# Patient Record
Sex: Female | Born: 1985 | Race: Black or African American | Hispanic: No | Marital: Married | Smoking: Current every day smoker
Health system: Southern US, Community
[De-identification: ages and names within clinical notes are randomized; demographics above are authoritative.]

## PROBLEM LIST (undated history)

## (undated) DIAGNOSIS — F419 Anxiety disorder, unspecified: Secondary | ICD-10-CM

## (undated) DIAGNOSIS — N390 Urinary tract infection, site not specified: Secondary | ICD-10-CM

## (undated) DIAGNOSIS — G43909 Migraine, unspecified, not intractable, without status migrainosus: Secondary | ICD-10-CM

## (undated) DIAGNOSIS — A599 Trichomoniasis, unspecified: Secondary | ICD-10-CM

## (undated) DIAGNOSIS — R87619 Unspecified abnormal cytological findings in specimens from cervix uteri: Secondary | ICD-10-CM

## (undated) DIAGNOSIS — A630 Anogenital (venereal) warts: Secondary | ICD-10-CM

## (undated) DIAGNOSIS — IMO0002 Reserved for concepts with insufficient information to code with codable children: Secondary | ICD-10-CM

## (undated) DIAGNOSIS — A549 Gonococcal infection, unspecified: Secondary | ICD-10-CM

## (undated) DIAGNOSIS — R569 Unspecified convulsions: Secondary | ICD-10-CM

## (undated) DIAGNOSIS — F32A Depression, unspecified: Secondary | ICD-10-CM

## (undated) DIAGNOSIS — F329 Major depressive disorder, single episode, unspecified: Secondary | ICD-10-CM

## (undated) DIAGNOSIS — N200 Calculus of kidney: Secondary | ICD-10-CM

## (undated) DIAGNOSIS — J069 Acute upper respiratory infection, unspecified: Secondary | ICD-10-CM

## (undated) HISTORY — PX: NO PAST SURGERIES: SHX2092

## (undated) HISTORY — PX: TUBAL LIGATION: SHX77

---

## 2006-09-05 ENCOUNTER — Inpatient Hospital Stay (HOSPITAL_COMMUNITY): Admission: AD | Admit: 2006-09-05 | Discharge: 2006-09-05 | Payer: Self-pay | Admitting: Obstetrics & Gynecology

## 2009-06-07 ENCOUNTER — Emergency Department (HOSPITAL_BASED_OUTPATIENT_CLINIC_OR_DEPARTMENT_OTHER): Admission: EM | Admit: 2009-06-07 | Discharge: 2009-06-08 | Payer: Self-pay | Admitting: Emergency Medicine

## 2009-06-08 ENCOUNTER — Inpatient Hospital Stay (HOSPITAL_COMMUNITY): Admission: AD | Admit: 2009-06-08 | Discharge: 2009-06-08 | Payer: Self-pay | Admitting: Obstetrics & Gynecology

## 2009-07-20 ENCOUNTER — Emergency Department (HOSPITAL_BASED_OUTPATIENT_CLINIC_OR_DEPARTMENT_OTHER): Admission: EM | Admit: 2009-07-20 | Discharge: 2009-07-20 | Payer: Self-pay | Admitting: Emergency Medicine

## 2010-03-02 ENCOUNTER — Emergency Department (HOSPITAL_BASED_OUTPATIENT_CLINIC_OR_DEPARTMENT_OTHER): Admission: EM | Admit: 2010-03-02 | Discharge: 2010-03-02 | Payer: Self-pay | Admitting: Emergency Medicine

## 2010-03-23 ENCOUNTER — Emergency Department (HOSPITAL_BASED_OUTPATIENT_CLINIC_OR_DEPARTMENT_OTHER): Admission: EM | Admit: 2010-03-23 | Discharge: 2010-03-23 | Payer: Self-pay | Admitting: Emergency Medicine

## 2010-04-15 ENCOUNTER — Emergency Department (HOSPITAL_BASED_OUTPATIENT_CLINIC_OR_DEPARTMENT_OTHER): Admission: EM | Admit: 2010-04-15 | Discharge: 2010-04-16 | Payer: Self-pay | Admitting: Emergency Medicine

## 2010-05-23 ENCOUNTER — Emergency Department (HOSPITAL_BASED_OUTPATIENT_CLINIC_OR_DEPARTMENT_OTHER): Admission: EM | Admit: 2010-05-23 | Discharge: 2010-05-23 | Payer: Self-pay | Admitting: Emergency Medicine

## 2011-02-27 LAB — URINE MICROSCOPIC-ADD ON

## 2011-02-27 LAB — PREGNANCY, URINE: Preg Test, Ur: NEGATIVE

## 2011-02-27 LAB — URINALYSIS, ROUTINE W REFLEX MICROSCOPIC
Nitrite: NEGATIVE
Protein, ur: NEGATIVE mg/dL
Specific Gravity, Urine: 1.02 (ref 1.005–1.030)
Urobilinogen, UA: 0.2 mg/dL (ref 0.0–1.0)

## 2011-03-06 LAB — URINALYSIS, ROUTINE W REFLEX MICROSCOPIC
Bilirubin Urine: NEGATIVE
Glucose, UA: NEGATIVE mg/dL
Hgb urine dipstick: NEGATIVE
Ketones, ur: NEGATIVE mg/dL
Protein, ur: NEGATIVE mg/dL
Urobilinogen, UA: 0.2 mg/dL (ref 0.0–1.0)

## 2011-03-06 LAB — BASIC METABOLIC PANEL
CO2: 28 mEq/L (ref 19–32)
Calcium: 9.5 mg/dL (ref 8.4–10.5)
Chloride: 106 mEq/L (ref 96–112)
Creatinine, Ser: 0.8 mg/dL (ref 0.4–1.2)
Glucose, Bld: 81 mg/dL (ref 70–99)
Sodium: 145 mEq/L (ref 135–145)

## 2011-03-06 LAB — URINE MICROSCOPIC-ADD ON

## 2011-03-06 LAB — CBC
Hemoglobin: 13 g/dL (ref 12.0–15.0)
MCHC: 33.3 g/dL (ref 30.0–36.0)
MCV: 86 fL (ref 78.0–100.0)
RDW: 13 % (ref 11.5–15.5)

## 2011-03-06 LAB — DIFFERENTIAL
Basophils Absolute: 0.1 10*3/uL (ref 0.0–0.1)
Basophils Relative: 1 % (ref 0–1)
Eosinophils Absolute: 0.1 10*3/uL (ref 0.0–0.7)
Monocytes Absolute: 0.7 10*3/uL (ref 0.1–1.0)
Monocytes Relative: 15 % — ABNORMAL HIGH (ref 3–12)
Neutro Abs: 2.6 10*3/uL (ref 1.7–7.7)

## 2011-03-18 LAB — URINALYSIS, ROUTINE W REFLEX MICROSCOPIC
Bilirubin Urine: NEGATIVE
Nitrite: NEGATIVE
Specific Gravity, Urine: 1.019 (ref 1.005–1.030)
Urobilinogen, UA: 1 mg/dL (ref 0.0–1.0)
pH: 8.5 — ABNORMAL HIGH (ref 5.0–8.0)

## 2011-03-18 LAB — WET PREP, GENITAL

## 2011-03-18 LAB — GC/CHLAMYDIA PROBE AMP, GENITAL: Chlamydia, DNA Probe: NEGATIVE

## 2011-03-18 LAB — PREGNANCY, URINE: Preg Test, Ur: NEGATIVE

## 2011-03-18 LAB — URINE MICROSCOPIC-ADD ON

## 2011-03-18 LAB — RPR: RPR Ser Ql: NONREACTIVE

## 2011-03-20 LAB — WET PREP, GENITAL
Trich, Wet Prep: NONE SEEN
Yeast Wet Prep HPF POC: NONE SEEN

## 2011-03-20 LAB — URINALYSIS, ROUTINE W REFLEX MICROSCOPIC
Bilirubin Urine: NEGATIVE
Glucose, UA: NEGATIVE mg/dL
Ketones, ur: 15 mg/dL — AB
Leukocytes, UA: NEGATIVE
Nitrite: NEGATIVE
Protein, ur: 30 mg/dL — AB
Specific Gravity, Urine: 1.021 (ref 1.005–1.030)
Urobilinogen, UA: 1 mg/dL (ref 0.0–1.0)
pH: 6.5 (ref 5.0–8.0)

## 2011-03-20 LAB — PREGNANCY, URINE: Preg Test, Ur: NEGATIVE

## 2011-03-20 LAB — GC/CHLAMYDIA PROBE AMP, GENITAL
Chlamydia, DNA Probe: NEGATIVE
GC Probe Amp, Genital: NEGATIVE

## 2011-03-20 LAB — URINE MICROSCOPIC-ADD ON

## 2011-05-14 ENCOUNTER — Emergency Department (HOSPITAL_BASED_OUTPATIENT_CLINIC_OR_DEPARTMENT_OTHER)
Admission: EM | Admit: 2011-05-14 | Discharge: 2011-05-14 | Disposition: A | Discharge status: Home / Self Care | Payer: Self-pay | Attending: Emergency Medicine | Admitting: Emergency Medicine

## 2011-05-14 DIAGNOSIS — S5010XA Contusion of unspecified forearm, initial encounter: Secondary | ICD-10-CM | POA: Insufficient documentation

## 2011-06-29 ENCOUNTER — Emergency Department (HOSPITAL_BASED_OUTPATIENT_CLINIC_OR_DEPARTMENT_OTHER)
Admission: EM | Admit: 2011-06-29 | Discharge: 2011-06-29 | Disposition: A | Discharge status: Home / Self Care | Payer: Self-pay | Attending: Emergency Medicine | Admitting: Emergency Medicine

## 2011-06-29 ENCOUNTER — Encounter: Payer: Self-pay | Admitting: *Deleted

## 2011-06-29 DIAGNOSIS — R10819 Abdominal tenderness, unspecified site: Secondary | ICD-10-CM | POA: Insufficient documentation

## 2011-06-29 DIAGNOSIS — N72 Inflammatory disease of cervix uteri: Secondary | ICD-10-CM | POA: Insufficient documentation

## 2011-06-29 DIAGNOSIS — N949 Unspecified condition associated with female genital organs and menstrual cycle: Secondary | ICD-10-CM | POA: Insufficient documentation

## 2011-06-29 DIAGNOSIS — R109 Unspecified abdominal pain: Secondary | ICD-10-CM | POA: Insufficient documentation

## 2011-06-29 LAB — CBC
HCT: 40.5 % (ref 36.0–46.0)
MCH: 27.8 pg (ref 26.0–34.0)
MCHC: 33.6 g/dL (ref 30.0–36.0)
MCV: 82.7 fL (ref 78.0–100.0)
Platelets: 185 10*3/uL (ref 150–400)
RDW: 13.8 % (ref 11.5–15.5)

## 2011-06-29 LAB — URINE MICROSCOPIC-ADD ON

## 2011-06-29 LAB — URINALYSIS, ROUTINE W REFLEX MICROSCOPIC
Bilirubin Urine: NEGATIVE
Nitrite: NEGATIVE
Protein, ur: NEGATIVE mg/dL
Urobilinogen, UA: 0.2 mg/dL (ref 0.0–1.0)

## 2011-06-29 LAB — COMPREHENSIVE METABOLIC PANEL
Albumin: 4.2 g/dL (ref 3.5–5.2)
BUN: 12 mg/dL (ref 6–23)
Calcium: 10.3 mg/dL (ref 8.4–10.5)
Creatinine, Ser: 0.7 mg/dL (ref 0.50–1.10)
Total Bilirubin: 0.6 mg/dL (ref 0.3–1.2)
Total Protein: 8.2 g/dL (ref 6.0–8.3)

## 2011-06-29 MED ORDER — AZITHROMYCIN 1 G PO PACK
1.0000 g | PACK | Freq: Once | ORAL | Status: AC
  Administered 2011-06-29: 1 g via ORAL
  Filled 2011-06-29: qty 1

## 2011-06-29 MED ORDER — CEFTRIAXONE SODIUM 250 MG IJ SOLR
250.0000 mg | Freq: Once | INTRAMUSCULAR | Status: AC
  Administered 2011-06-29: 250 mg via INTRAMUSCULAR
  Filled 2011-06-29: qty 250

## 2011-06-29 MED ORDER — LIDOCAINE HCL (PF) 1 % IJ SOLN
INTRAMUSCULAR | Status: AC
  Administered 2011-06-29: 03:00:00 via INTRAMUSCULAR
  Filled 2011-06-29: qty 5

## 2011-06-29 NOTE — ED Provider Notes (Signed)
History    chief complaint pelvic pain History of present illness complains of pelvic pain (points to suprapubic area) onset 1.5 weeks ago. Discomfort is constant not made worse by anything no dyspareunia no vaginal discharge last menstrual period 3 weeks ago lasted 8 days, 3 days longer than normal . Pain mild at present. Pain is improved after treatment with ibuprofen. Last bowel movement today, normal. No change in appetite  Chief Complaint  Patient presents with  . Abdominal Cramping   Patient is a 25 y.o. female presenting with cramps.  Abdominal Cramping    History reviewed. No pertinent past medical history.  History reviewed. No pertinent past surgical history.  History reviewed. No pertinent family history.  History  Substance Use Topics  . Smoking status: Current Everyday Smoker -- 0.5 packs/day  . Smokeless tobacco: Not on file  . Alcohol Use: No    OB History    Grav Para Term Preterm Abortions TAB SAB Ect Mult Living                  Review of Systems  Constitutional: Negative.   HENT: Negative.   Respiratory: Negative.   Cardiovascular: Negative.   Gastrointestinal: Negative.   Genitourinary: Positive for pelvic pain.  Musculoskeletal: Negative.   Skin: Negative.   Neurological: Negative.   Hematological: Negative.   Psychiatric/Behavioral: Negative.     Physical Exam  BP 125/79  Pulse 96  Temp(Src) 99.6 F (37.6 C) (Oral)  Resp 16  Wt 160 lb (72.576 kg)  SpO2 100%  LMP 06/20/2011  Physical Exam  Nursing note and vitals reviewed. Constitutional: She appears well-developed and well-nourished.  HENT:  Head: Normocephalic and atraumatic.  Eyes: Conjunctivae are normal. Pupils are equal, round, and reactive to light.  Neck: Neck supple. No tracheal deviation present. No thyromegaly present.  Cardiovascular: Normal rate and regular rhythm.   No murmur heard. Pulmonary/Chest: Effort normal and breath sounds normal.  Abdominal: Soft. Bowel  sounds are normal. She exhibits no distension. There is tenderness in the suprapubic area. There is no rigidity, no guarding and no CVA tenderness. Hernia confirmed negative in the ventral area.    Genitourinary:     Musculoskeletal: Normal range of motion. She exhibits no edema and no tenderness.  Neurological: She is alert. Coordination normal.  Skin: Skin is warm and dry. No rash noted.  Psychiatric: She has a normal mood and affect.    ED Course  Procedures  MDM Results for orders placed during the hospital encounter of 06/29/11  URINALYSIS, ROUTINE W REFLEX MICROSCOPIC      Component Value Range   Color, Urine YELLOW  YELLOW    Appearance CLOUDY (*) CLEAR    Specific Gravity, Urine 1.031 (*) 1.005 - 1.030    pH 6.0  5.0 - 8.0    Glucose, UA NEGATIVE  NEGATIVE (mg/dL)   Hgb urine dipstick MODERATE (*) NEGATIVE    Bilirubin Urine NEGATIVE  NEGATIVE    Ketones, ur 15 (*) NEGATIVE (mg/dL)   Protein, ur NEGATIVE  NEGATIVE (mg/dL)   Urobilinogen, UA 0.2  0.0 - 1.0 (mg/dL)   Nitrite NEGATIVE  NEGATIVE    Leukocytes, UA NEGATIVE  NEGATIVE   PREGNANCY, URINE      Component Value Range   Preg Test, Ur NEGATIVE    CBC      Component Value Range   WBC 8.0  4.0 - 10.5 (K/uL)   RBC 4.90  3.87 - 5.11 (MIL/uL)   Hemoglobin 13.6  12.0 - 15.0 (g/dL)   HCT 16.1  09.6 - 04.5 (%)   MCV 82.7  78.0 - 100.0 (fL)   MCH 27.8  26.0 - 34.0 (pg)   MCHC 33.6  30.0 - 36.0 (g/dL)   RDW 40.9  81.1 - 91.4 (%)   Platelets 185  150 - 400 (K/uL)  COMPREHENSIVE METABOLIC PANEL      Component Value Range   Sodium 141  135 - 145 (mEq/L)   Potassium 4.3  3.5 - 5.1 (mEq/L)   Chloride 103  96 - 112 (mEq/L)   CO2 28  19 - 32 (mEq/L)   Glucose, Bld 103 (*) 70 - 99 (mg/dL)   BUN 12  6 - 23 (mg/dL)   Creatinine, Ser 7.82  0.50 - 1.10 (mg/dL)   Calcium 95.6  8.4 - 10.5 (mg/dL)   Total Protein 8.2  6.0 - 8.3 (g/dL)   Albumin 4.2  3.5 - 5.2 (g/dL)   AST 17  0 - 37 (U/L)   ALT 9  0 - 35 (U/L)    Alkaline Phosphatase 83  39 - 117 (U/L)   Total Bilirubin 0.6  0.3 - 1.2 (mg/dL)   GFR calc non Af Amer >60  >60 (mL/min)   GFR calc Af Amer >60  >60 (mL/min)  URINE MICROSCOPIC-ADD ON      Component Value Range   Squamous Epithelial / LPF MANY (*) RARE    WBC, UA 0-2  <3 (WBC/hpf)   RBC / HPF 3-6  <3 (RBC/hpf)   Bacteria, UA MANY (*) RARE    Urine-Other MUCOUS PRESENT      Note urine contaminated but doubt UTI i.e. patient denies dysuria or frequency. Physical exam most consistent with cervicitis plan Rocephin, Zithromax. Safe sex encouraged.    Doug Sou, MD 06/29/11 0330

## 2011-06-29 NOTE — ED Notes (Signed)
Pt c/o lower abd cramping, pt states " period" is 2 weeks early.

## 2011-06-29 NOTE — ED Notes (Signed)
Pelvic exam performed by Dr Ethelda Chick and Helmut Muster, RN. Specimens collected, pt tolerated well.

## 2011-09-05 ENCOUNTER — Encounter (HOSPITAL_COMMUNITY): Payer: Self-pay | Admitting: *Deleted

## 2011-09-05 ENCOUNTER — Inpatient Hospital Stay (HOSPITAL_COMMUNITY)
Admission: AD | Admit: 2011-09-05 | Discharge: 2011-09-05 | Disposition: A | Discharge status: Home / Self Care | Payer: Medicaid Other | Source: Ambulatory Visit | Attending: Obstetrics & Gynecology | Admitting: Obstetrics & Gynecology

## 2011-09-05 DIAGNOSIS — O2390 Unspecified genitourinary tract infection in pregnancy, unspecified trimester: Secondary | ICD-10-CM | POA: Insufficient documentation

## 2011-09-05 DIAGNOSIS — B9689 Other specified bacterial agents as the cause of diseases classified elsewhere: Secondary | ICD-10-CM | POA: Insufficient documentation

## 2011-09-05 DIAGNOSIS — N760 Acute vaginitis: Secondary | ICD-10-CM | POA: Insufficient documentation

## 2011-09-05 DIAGNOSIS — R1031 Right lower quadrant pain: Secondary | ICD-10-CM | POA: Insufficient documentation

## 2011-09-05 DIAGNOSIS — A499 Bacterial infection, unspecified: Secondary | ICD-10-CM | POA: Insufficient documentation

## 2011-09-05 HISTORY — DX: Unspecified abnormal cytological findings in specimens from cervix uteri: R87.619

## 2011-09-05 HISTORY — DX: Trichomoniasis, unspecified: A59.9

## 2011-09-05 HISTORY — DX: Reserved for concepts with insufficient information to code with codable children: IMO0002

## 2011-09-05 HISTORY — DX: Calculus of kidney: N20.0

## 2011-09-05 HISTORY — DX: Depression, unspecified: F32.A

## 2011-09-05 HISTORY — DX: Major depressive disorder, single episode, unspecified: F32.9

## 2011-09-05 HISTORY — DX: Urinary tract infection, site not specified: N39.0

## 2011-09-05 HISTORY — DX: Anogenital (venereal) warts: A63.0

## 2011-09-05 HISTORY — DX: Acute upper respiratory infection, unspecified: J06.9

## 2011-09-05 HISTORY — DX: Unspecified convulsions: R56.9

## 2011-09-05 HISTORY — DX: Anxiety disorder, unspecified: F41.9

## 2011-09-05 HISTORY — DX: Gonococcal infection, unspecified: A54.9

## 2011-09-05 LAB — URINALYSIS, ROUTINE W REFLEX MICROSCOPIC
Bilirubin Urine: NEGATIVE
Specific Gravity, Urine: 1.02 (ref 1.005–1.030)
pH: 7.5 (ref 5.0–8.0)

## 2011-09-05 LAB — CBC
HCT: 37.9 % (ref 36.0–46.0)
Hemoglobin: 12.5 g/dL (ref 12.0–15.0)
MCV: 85.2 fL (ref 78.0–100.0)
RBC: 4.45 MIL/uL (ref 3.87–5.11)
WBC: 8.6 10*3/uL (ref 4.0–10.5)

## 2011-09-05 LAB — WET PREP, GENITAL
Trich, Wet Prep: NONE SEEN
Yeast Wet Prep HPF POC: NONE SEEN

## 2011-09-05 LAB — POCT PREGNANCY, URINE: Preg Test, Ur: POSITIVE

## 2011-09-05 LAB — URINE MICROSCOPIC-ADD ON

## 2011-09-05 MED ORDER — ONDANSETRON 4 MG PO TBDP
4.0000 mg | ORAL_TABLET | Freq: Once | ORAL | Status: AC
  Administered 2011-09-05: 4 mg via ORAL
  Filled 2011-09-05: qty 1

## 2011-09-05 MED ORDER — METRONIDAZOLE 500 MG PO TABS: 500.0000 mg | ORAL_TABLET | Freq: Two times a day (BID) | ORAL | Status: AC

## 2011-09-05 MED ORDER — PROMETHAZINE HCL 25 MG PO TABS: 25.0000 mg | ORAL_TABLET | Freq: Four times a day (QID) | ORAL | Status: DC | PRN

## 2011-09-05 NOTE — ED Provider Notes (Signed)
History   Pt presents today c/o nausea and RLQ pain that has worsened over the past 2 wks. She states she had a positive UPT at [facility] the beginning of Sept but they were not able to confirm and IUP. She denies fever, vag dc, bleeding, or any other sx at this time.  Chief Complaint  Patient presents with  . Abdominal Pain   HPI  OB History    Grav Para Term Preterm Abortions TAB SAB Ect Mult Living   2 1 1  0 0 0 0 0 0 1      Past Medical History  Diagnosis Date  . Seizures     in high school, unknown cause (last age 28)  . Recurrent upper respiratory infection (URI)   . Urinary tract infection   . Kidney stone     during pregnancy  . Anxiety     hx of meds  . Depression     hx of meds./PTSD  . Abnormal Pap smear   . Gonorrhea   . Trichomonas   . Genital warts     Past Surgical History  Procedure Date  . No past surgeries     No family history on file.  History  Substance Use Topics  . Smoking status: Former Smoker -- 0.5 packs/day  . Smokeless tobacco: Not on file   Comment: quit with preg  . Alcohol Use: Yes     occ, none with preg    Allergies:  Allergies  Allergen Reactions  . Ivp Dye (Iodinated Diagnostic Agents) Anaphylaxis    No prescriptions prior to admission    Review of Systems  Constitutional: Negative for fever.  Cardiovascular: Negative for chest pain.  Gastrointestinal: Positive for nausea and abdominal pain. Negative for vomiting, diarrhea and constipation.  Genitourinary: Negative for dysuria, urgency, frequency and hematuria.  Neurological: Negative for dizziness and headaches.  Psychiatric/Behavioral: Negative for depression and suicidal ideas.   Physical Exam   Blood pressure 120/77, pulse 90, temperature 98.7 F (37.1 C), temperature source Oral, resp. rate 20, height 5\' 7"  (1.702 m), weight 173 lb (78.472 kg), last menstrual period 07/05/2011.  Physical Exam  Constitutional: She is oriented to person, place, and  time. She appears well-developed and well-nourished. No distress.  HENT:  Head: Normocephalic and atraumatic.  Eyes: EOM are normal. Pupils are equal, round, and reactive to light.  GI: Soft. She exhibits no distension. There is no tenderness. There is no rebound and no guarding.  Genitourinary: No bleeding around the vagina. Vaginal discharge found.       Cervix Lg/closed. Thin vag dc present.  Neurological: She is alert and oriented to person, place, and time.  Skin: Skin is warm and dry. She is not diaphoretic.  Psychiatric: She has a normal mood and affect. Her behavior is normal. Judgment and thought content normal.    MAU Course  Procedures  Bedside US demonstrates a single IUP with cardiac activity @ 7.6wks.  Wet prep done.  Results for orders placed during the hospital encounter of 09/05/11 (from the past 24 hour(s))  CBC     Status: Normal   Collection Time   09/05/11  8:30 AM      Component Value Range   WBC 8.6  4.0 - 10.5 (K/uL)   RBC 4.45  3.87 - 5.11 (MIL/uL)   Hemoglobin 12.5  12.0 - 15.0 (g/dL)   HCT 16.1  09.6 - 04.5 (%)   MCV 85.2  78.0 - 100.0 (fL)  MCH 28.1  26.0 - 34.0 (pg)   MCHC 33.0  30.0 - 36.0 (g/dL)   RDW 21.3  08.6 - 57.8 (%)   Platelets 197  150 - 400 (K/uL)  URINALYSIS, ROUTINE W REFLEX MICROSCOPIC     Status: Abnormal   Collection Time   09/05/11  8:46 AM      Component Value Range   Color, Urine YELLOW  YELLOW    Appearance CLEAR  CLEAR    Specific Gravity, Urine 1.020  1.005 - 1.030    pH 7.5  5.0 - 8.0    Glucose, UA NEGATIVE  NEGATIVE (mg/dL)   Hgb urine dipstick TRACE (*) NEGATIVE    Bilirubin Urine NEGATIVE  NEGATIVE    Ketones, ur NEGATIVE  NEGATIVE (mg/dL)   Protein, ur NEGATIVE  NEGATIVE (mg/dL)   Urobilinogen, UA 0.2  0.0 - 1.0 (mg/dL)   Nitrite NEGATIVE  NEGATIVE    Leukocytes, UA NEGATIVE  NEGATIVE   URINE MICROSCOPIC-ADD ON     Status: Abnormal   Collection Time   09/05/11  8:46 AM      Component Value Range   Squamous  Epithelial / LPF FEW (*) RARE    WBC, UA 0-2  <3 (WBC/hpf)   RBC / HPF 0-2  <3 (RBC/hpf)  POCT PREGNANCY, URINE     Status: Normal   Collection Time   09/05/11  8:52 AM      Component Value Range   Preg Test, Ur POSITIVE    WET PREP, GENITAL     Status: Abnormal   Collection Time   09/05/11  9:03 AM      Component Value Range   Yeast, Wet Prep NONE SEEN  NONE SEEN    Trich, Wet Prep NONE SEEN  NONE SEEN    Clue Cells, Wet Prep MODERATE (*) NONE SEEN    WBC, Wet Prep HPF POC FEW (*) NONE SEEN      Assessment and Plan  BV: discussed with pt at length. Will tx with Flagyl. Warned of antabuse reaction.  Pain in preg: pt has an IUP. She will begin prenatal care. Discussed diet, activity, risks, and precautions.  Clinton Gallant. Sophiagrace Benbrook III, DrHSc, MPAS, PA-C  09/05/2011, 9:03 AM   Henrietta Hoover, PA 09/05/11 [phone]

## 2011-09-05 NOTE — Progress Notes (Signed)
Pressure in RLQ.  Found out preg on 09/04 at [facility].  Did [place] and blood work.  Cramping in RLQ and low back.  No bleeding.

## 2011-09-05 NOTE — ED Provider Notes (Signed)
Attestation of Attending Supervision of Advanced Practitioner: Evaluation and management procedures were performed by the PA/NP/CNM/OB Fellow under my supervision/collaboration. Chart reviewed and agree with management and plan.  ANYANWU,UGONNA A 09/05/2011 12:01 PM

## 2011-10-08 ENCOUNTER — Encounter (HOSPITAL_BASED_OUTPATIENT_CLINIC_OR_DEPARTMENT_OTHER): Payer: Self-pay | Admitting: *Deleted

## 2011-10-08 ENCOUNTER — Emergency Department (HOSPITAL_BASED_OUTPATIENT_CLINIC_OR_DEPARTMENT_OTHER)
Admission: EM | Admit: 2011-10-08 | Discharge: 2011-10-08 | Disposition: A | Discharge status: Home / Self Care | Payer: Medicaid Other | Attending: Emergency Medicine | Admitting: Emergency Medicine

## 2011-10-08 DIAGNOSIS — R109 Unspecified abdominal pain: Secondary | ICD-10-CM | POA: Insufficient documentation

## 2011-10-08 DIAGNOSIS — O200 Threatened abortion: Secondary | ICD-10-CM | POA: Insufficient documentation

## 2011-10-08 DIAGNOSIS — Z3490 Encounter for supervision of normal pregnancy, unspecified, unspecified trimester: Secondary | ICD-10-CM

## 2011-10-08 MED ORDER — ONDANSETRON 4 MG PO TBDP
4.0000 mg | ORAL_TABLET | Freq: Once | ORAL | Status: AC
  Administered 2011-10-08: 4 mg via ORAL
  Filled 2011-10-08: qty 1

## 2011-10-08 NOTE — ED Notes (Signed)
Pt states she is 13.[redacted] wks pregnant and having contractions. Recently hospitalized with hyper emesis. Went to [facility], but was "triaged and placed in lobby".

## 2011-10-08 NOTE — ED Provider Notes (Signed)
History     CSN: [id number] Arrival date & time: 10/08/2011  8:29 PM   First MD Initiated Contact with Patient 10/08/11 2039      Chief Complaint  Patient presents with  . Contractions    (Consider location/radiation/quality/duration/timing/severity/associated sxs/prior treatment) Patient is a 25 y.o. female presenting with cramps. The history is provided by the patient. No language interpreter was used.  Abdominal Cramping The primary symptoms of the illness include abdominal pain. The current episode started 13 to 24 hours ago. The onset of the illness was gradual. The problem has been gradually worsening.  The illness is associated with a recent illness. The patient states that she believes she is currently pregnant. The patient has not had a change in bowel habit. Additional symptoms associated with the illness include hematuria and frequency. Symptoms associated with the illness do not include constipation or back pain. Significant associated medical issues do not include PUD, GERD, inflammatory bowel disease or diabetes.  Pt is worried taht she is having contractions.  Pt reports she was admitted at [facility] hospital a week ago for hyperemesis. Pt went to [facility] ED.  Pt reports she had a urine done there.  Past Medical History  Diagnosis Date  . Seizures     in high school, unknown cause (last age 62)  . Recurrent upper respiratory infection (URI)   . Urinary tract infection   . Kidney stone     during pregnancy  . Anxiety     hx of meds  . Depression     hx of meds./PTSD  . Abnormal Pap smear   . Gonorrhea   . Trichomonas   . Genital warts     Past Surgical History  Procedure Date  . No past surgeries     History reviewed. No pertinent family history.  History  Substance Use Topics  . Smoking status: Former Smoker -- 0.5 packs/day  . Smokeless tobacco: Not on file   Comment: quit with preg  . Alcohol Use: Yes     occ, none with preg    OB History    Grav Para Term Preterm Abortions TAB SAB Ect Mult Living   2 1 1  0 0 0 0 0 0 1      Review of Systems  Gastrointestinal: Positive for abdominal pain. Negative for constipation.  Genitourinary: Positive for frequency and hematuria.  Musculoskeletal: Negative for back pain.  All other systems reviewed and are negative.    Allergies  Ivp dye  Home Medications   Current Outpatient Rx  Name Route Sig Dispense Refill  . ONDANSETRON HCL 8 MG PO TABS Oral Take 8 mg by mouth every 8 (eight) hours as needed. For nausea    . PROMETHAZINE HCL 25 MG PO TABS Oral Take 25 mg by mouth every 6 (six) hours as needed. For nausea      BP 123/78  Pulse 114  Temp(Src) 98.5 F (36.9 C) (Oral)  Resp 20  Ht 5\' 7"  (1.702 m)  Wt 175 lb (79.379 kg)  BMI 27.41 kg/m2  SpO2 100%  LMP 07/05/2011  Physical Exam  Nursing note and vitals reviewed. Constitutional: She appears well-developed and well-nourished.  HENT:  Head: Normocephalic.  Right Ear: External ear normal.  Left Ear: External ear normal.  Nose: Nose normal.  Mouth/Throat: Oropharynx is clear and moist.  Eyes: Conjunctivae and EOM are normal. Pupils are equal, round, and reactive to light.  Neck: Normal range of motion. Neck supple.  Cardiovascular: Normal  rate.   Pulmonary/Chest: Effort normal.  Abdominal: Soft.  Musculoskeletal: Normal range of motion.  Neurological: She is alert.  Skin: Skin is warm.  Psychiatric: She has a normal mood and affect.    ED Course  Procedures (including critical care time)  Labs Reviewed - No data to display No results found.   No diagnosis found.    MDM  Ultrasound good fetal movement,  Fht's 140's.  No evidence of contractions,  Cervix is closed.  Pt advised to follow up at [facility].  Pt given zofran here.  Pt tolerating po fluids well.  Pt advised to drink fluids to stay hydrated.        Langston Masker, [place] 10/08/11 2141  Langston Masker, [place] 10/08/11 2144  Medical screening  examination/treatment/procedure(s) were conducted as a shared visit with non-physician practitioner(s) and myself.  I was immediately available.     Sunnie Nielsen, MD 10/08/11 [phone]

## 2013-07-22 ENCOUNTER — Emergency Department (HOSPITAL_BASED_OUTPATIENT_CLINIC_OR_DEPARTMENT_OTHER)
Admission: EM | Admit: 2013-07-22 | Discharge: 2013-07-22 | Disposition: A | Discharge status: Home / Self Care | Payer: Self-pay | Attending: Emergency Medicine | Admitting: Emergency Medicine

## 2013-07-22 ENCOUNTER — Emergency Department (HOSPITAL_BASED_OUTPATIENT_CLINIC_OR_DEPARTMENT_OTHER): Payer: Self-pay

## 2013-07-22 ENCOUNTER — Encounter (HOSPITAL_BASED_OUTPATIENT_CLINIC_OR_DEPARTMENT_OTHER): Payer: Self-pay | Admitting: *Deleted

## 2013-07-22 DIAGNOSIS — J069 Acute upper respiratory infection, unspecified: Secondary | ICD-10-CM | POA: Insufficient documentation

## 2013-07-22 DIAGNOSIS — R05 Cough: Secondary | ICD-10-CM | POA: Insufficient documentation

## 2013-07-22 DIAGNOSIS — Z87442 Personal history of urinary calculi: Secondary | ICD-10-CM | POA: Insufficient documentation

## 2013-07-22 DIAGNOSIS — Z8669 Personal history of other diseases of the nervous system and sense organs: Secondary | ICD-10-CM | POA: Insufficient documentation

## 2013-07-22 DIAGNOSIS — R0789 Other chest pain: Secondary | ICD-10-CM | POA: Insufficient documentation

## 2013-07-22 DIAGNOSIS — Z87440 Personal history of urinary (tract) infections: Secondary | ICD-10-CM | POA: Insufficient documentation

## 2013-07-22 DIAGNOSIS — J029 Acute pharyngitis, unspecified: Secondary | ICD-10-CM | POA: Insufficient documentation

## 2013-07-22 DIAGNOSIS — R51 Headache: Secondary | ICD-10-CM | POA: Insufficient documentation

## 2013-07-22 DIAGNOSIS — Z8619 Personal history of other infectious and parasitic diseases: Secondary | ICD-10-CM | POA: Insufficient documentation

## 2013-07-22 DIAGNOSIS — J3489 Other specified disorders of nose and nasal sinuses: Secondary | ICD-10-CM | POA: Insufficient documentation

## 2013-07-22 DIAGNOSIS — Z8659 Personal history of other mental and behavioral disorders: Secondary | ICD-10-CM | POA: Insufficient documentation

## 2013-07-22 DIAGNOSIS — R059 Cough, unspecified: Secondary | ICD-10-CM | POA: Insufficient documentation

## 2013-07-22 DIAGNOSIS — Z87891 Personal history of nicotine dependence: Secondary | ICD-10-CM | POA: Insufficient documentation

## 2013-07-22 MED ORDER — IBUPROFEN 800 MG PO TABS
800.0000 mg | ORAL_TABLET | Freq: Once | ORAL | Status: AC
  Administered 2013-07-22: 800 mg via ORAL
  Filled 2013-07-22: qty 1

## 2013-07-22 NOTE — ED Provider Notes (Signed)
CSN: [id number]     Arrival date & time 07/22/13  2051 History     First MD Initiated Contact with Patient 07/22/13 2110     Chief Complaint  Patient presents with  . Shortness of Breath   (Consider location/radiation/quality/duration/timing/severity/associated sxs/prior Treatment) Patient is a 27 y.o. female presenting with URI. The history is provided by the patient.  URI Presenting symptoms: congestion, cough and sore throat   Presenting symptoms: no ear pain and no fever   Severity:  Moderate Onset quality:  Sudden Duration:  2 days Timing:  Constant Progression:  Worsening Chronicity:  New Associated symptoms: headaches   Associated symptoms: no neck pain and no wheezing   Risk factors: sick contacts (son has been sick w/in last week)     Past Medical History  Diagnosis Date  . Seizures     in high school, unknown cause (last age 68)  . Recurrent upper respiratory infection (URI)   . Urinary tract infection   . Kidney stone     during pregnancy  . Anxiety     hx of meds  . Depression     hx of meds./PTSD  . Abnormal Pap smear   . Gonorrhea   . Trichomonas   . Genital warts    Past Surgical History  Procedure Laterality Date  . No past surgeries     No family history on file. History  Substance Use Topics  . Smoking status: Former Smoker -- 0.50 packs/day  . Smokeless tobacco: Not on file     Comment: quit with preg  . Alcohol Use: Yes     Comment: occ, none with preg   OB History   Grav Para Term Preterm Abortions TAB SAB Ect Mult Living   2 1 1  0 0 0 0 0 0 1     Review of Systems  Constitutional: Negative for fever.  HENT: Positive for congestion and sore throat. Negative for ear pain and neck pain.   Respiratory: Positive for cough and chest tightness. Negative for wheezing.   Gastrointestinal: Negative for vomiting and abdominal pain.  Genitourinary: Negative for dysuria.  Neurological: Positive for headaches. Negative for weakness and  numbness.  All other systems reviewed and are negative.    Allergies  Ivp dye  Home Medications   Current Outpatient Rx  Name  Route  Sig  Dispense  Refill  . ondansetron (ZOFRAN) 8 MG tablet   Oral   Take 8 mg by mouth every 8 (eight) hours as needed. For nausea         . EXPIRED: promethazine (PHENERGAN) 25 MG tablet   Oral   Take 1 tablet (25 mg total) by mouth every 6 (six) hours as needed for nausea.   30 tablet   0   . promethazine (PHENERGAN) 25 MG tablet   Oral   Take 25 mg by mouth every 6 (six) hours as needed. For nausea          BP 141/84  Pulse 91  Temp(Src) 98.7 F (37.1 C) (Oral)  Resp 18  Ht 5\' 7"  (1.702 m)  Wt 157 lb (71.215 kg)  BMI 24.58 kg/m2  SpO2 100%  LMP 07/17/2013  Breastfeeding? Unknown Physical Exam  Vitals reviewed. Constitutional: She is oriented to person, place, and time. She appears well-developed and well-nourished.  HENT:  Head: Normocephalic and atraumatic.  Right Ear: External ear normal.  Left Ear: External ear normal.  Nose: Nose normal.  Mouth/Throat: No oropharyngeal exudate.  Eyes: Right eye exhibits no discharge. Left eye exhibits no discharge.  Cardiovascular: Normal rate, regular rhythm and normal heart sounds.   Pulmonary/Chest: Effort normal and breath sounds normal. She has no wheezes. She has no rales.  Abdominal: Soft. There is no tenderness.  Neurological: She is alert and oriented to person, place, and time. She has normal strength. No sensory deficit.  Skin: Skin is warm and dry.    ED Course   Procedures (including critical care time)  Labs Reviewed - No data to display Dg Chest 2 View  07/22/2013   *RADIOLOGY REPORT*  Clinical Data: Chest pain and shortness of breath  CHEST - 2 VIEW  Comparison: None.  Findings: Cardiomediastinal silhouette is within normal limits. The lungs are clear. No pleural effusion.  No pneumothorax.  No acute osseous abnormality.  IMPRESSION: Normal chest.   Original Report  Authenticated By: Christiana Pellant, M.D.   1. Upper respiratory infection     MDM  27 year old female with a constellation of symptoms consistent with an upper respiratory infection. All of her symptoms started within the last couple of days. The symptoms include headache, chest pain, sore throat and congestion. No signs of pneumonia or other bacterial illness. She does have some pleuritic chest pain this seems to be associated with her cough. She is otherwise low risk for PE is negative perc. This is stable for her symptomatic care at home for her URI.  Audree Camel, MD 07/23/13 [phone]

## 2013-07-22 NOTE — ED Notes (Signed)
Pt c/o chest tightness, SOB, non-productive cough, and HA that began yesterday. Pt recently discovered mold in her residence.

## 2013-07-22 NOTE — ED Notes (Signed)
Patient transported to X-ray 

## 2014-10-12 ENCOUNTER — Encounter (HOSPITAL_BASED_OUTPATIENT_CLINIC_OR_DEPARTMENT_OTHER): Payer: Self-pay | Admitting: *Deleted

## 2015-02-28 ENCOUNTER — Emergency Department (HOSPITAL_BASED_OUTPATIENT_CLINIC_OR_DEPARTMENT_OTHER)
Admission: EM | Admit: 2015-02-28 | Discharge: 2015-02-28 | Disposition: A | Discharge status: Home / Self Care | Payer: 59 | Attending: Emergency Medicine | Admitting: Emergency Medicine

## 2015-02-28 ENCOUNTER — Encounter (HOSPITAL_BASED_OUTPATIENT_CLINIC_OR_DEPARTMENT_OTHER): Payer: Self-pay

## 2015-02-28 DIAGNOSIS — Z8619 Personal history of other infectious and parasitic diseases: Secondary | ICD-10-CM | POA: Insufficient documentation

## 2015-02-28 DIAGNOSIS — Z87440 Personal history of urinary (tract) infections: Secondary | ICD-10-CM | POA: Insufficient documentation

## 2015-02-28 DIAGNOSIS — Z87442 Personal history of urinary calculi: Secondary | ICD-10-CM | POA: Insufficient documentation

## 2015-02-28 DIAGNOSIS — Z79899 Other long term (current) drug therapy: Secondary | ICD-10-CM | POA: Insufficient documentation

## 2015-02-28 DIAGNOSIS — Z87891 Personal history of nicotine dependence: Secondary | ICD-10-CM | POA: Diagnosis not present

## 2015-02-28 DIAGNOSIS — R0981 Nasal congestion: Secondary | ICD-10-CM | POA: Diagnosis present

## 2015-02-28 DIAGNOSIS — J111 Influenza due to unidentified influenza virus with other respiratory manifestations: Secondary | ICD-10-CM | POA: Insufficient documentation

## 2015-02-28 DIAGNOSIS — Z8659 Personal history of other mental and behavioral disorders: Secondary | ICD-10-CM | POA: Diagnosis not present

## 2015-02-28 NOTE — Discharge Instructions (Signed)

## 2015-02-28 NOTE — ED Provider Notes (Signed)
CSN: [id number]     Arrival date & time 02/28/15  0905 History   First MD Initiated Contact with Patient 02/28/15 0915     Chief Complaint  Patient presents with  . Nasal Congestion     (Consider location/radiation/quality/duration/timing/severity/associated sxs/prior Treatment) HPI Comments: Patient presents with flulike symptoms. She was diagnosed with the flu 2 days ago. She had a rapid flu a [facility] that was positive. Her symptoms started the day prior to the positive flu test. This is the fourth day of symptoms. She's been taking Tamiflu for 2 days. She states overall she feels a little bit better but doesn't feel significantly better. She was having a little bit of shortness of breath initially but denies any current shortness of breath. She feels achy all over. She's continuing to run some intermittent fevers. She denies any nausea or vomiting. She continues to have cough and nasal congestion. She's here with her 65-year-old who now has the same symptoms.   Past Medical History  Diagnosis Date  . Seizures     in high school, unknown cause (last age 28)  . Recurrent upper respiratory infection (URI)   . Urinary tract infection   . Kidney stone     during pregnancy  . Anxiety     hx of meds  . Depression     hx of meds./PTSD  . Abnormal Pap smear   . Gonorrhea   . Trichomonas   . Genital warts    Past Surgical History  Procedure Laterality Date  . No past surgeries     No family history on file. History  Substance Use Topics  . Smoking status: Former Smoker -- 0.50 packs/day  . Smokeless tobacco: Not on file     Comment: quit with preg  . Alcohol Use: Yes     Comment: occ, none with preg   OB History    Gravida Para Term Preterm AB TAB SAB Ectopic Multiple Living   0 0 0 0 0 0 1     Review of Systems  Constitutional: Positive for fever, chills and fatigue. Negative for diaphoresis.  HENT: Positive for congestion and rhinorrhea. Negative  for sneezing.   Eyes: Negative.   Respiratory: Positive for cough. Negative for chest tightness and shortness of breath.   Cardiovascular: Negative for chest pain and leg swelling.  Gastrointestinal: Negative for nausea, vomiting, abdominal pain, diarrhea and blood in stool.  Genitourinary: Negative for frequency, hematuria, flank pain and difficulty urinating.  Musculoskeletal: Positive for myalgias. Negative for back pain and arthralgias.  Skin: Negative for rash.  Neurological: Negative for dizziness, speech difficulty, weakness, numbness and headaches.      Allergies  Ivp dye  Home Medications   Prior to Admission medications   Medication Sig Start Date End Date Taking? Authorizing Provider  oseltamivir (TAMIFLU) 75 MG capsule Take 75 mg by mouth 2 (two) times daily.    Yes Historical Provider, MD  promethazine (PHENERGAN) 25 MG tablet Take 25 mg by mouth every 6 (six) hours as needed. For nausea    Historical Provider, MD   BP 118/76 mmHg  Pulse 84  Temp(Src) 98.9 F (37.2 C) (Oral)  Resp 18  Ht  (1.702 m)  Wt 155 lb (70.308 kg)  BMI 24.27 kg/m2  SpO2 100% Physical Exam  Constitutional: She is oriented to person, place, and time. She appears well-developed and well-nourished.  HENT:  Head: Normocephalic and atraumatic.  Right Ear: External ear  normal.  Left Ear: External ear normal.  Mouth/Throat: Oropharynx is clear and moist.  Eyes: Pupils are equal, round, and reactive to light.  Neck: Normal range of motion. Neck supple.  Cardiovascular: Normal rate, regular rhythm and normal heart sounds.   Pulmonary/Chest: Effort normal and breath sounds normal. No respiratory distress. She has no wheezes. She has no rales. She exhibits no tenderness.  Abdominal: Soft. Bowel sounds are normal. There is no tenderness. There is no rebound and no guarding.  Musculoskeletal: Normal range of motion. She exhibits no edema.  Lymphadenopathy:    She has no cervical adenopathy.   Neurological: She is alert and oriented to person, place, and time.  Skin: Skin is warm and dry. No rash noted.  Psychiatric: She has a normal mood and affect.    ED Course  Procedures (including critical care time) Labs Review Labs Reviewed - No data to display  Imaging Review No results found.   EKG Interpretation None      MDM   Final diagnoses:  Influenza    Patient presents with flulike symptoms. She had a recently positive rapid flu test. She is currently on Tamiflu. Her lungs are clear and she has no hypoxia. She has no suggestions of pneumonia. She has no vomiting. I advised her in symptomatic care and to continue the Tamiflu. I advised her return if she has any worsening symptoms.    Rolan BuccoMelanie Brelyn Woehl, MD 02/28/15 [phone]

## 2015-02-28 NOTE — ED Notes (Addendum)
Patient here with congestion and cough since Thursday. Describes as non-productive. Patient reports that she was started on tamiflu Thursday after being [facility].

## 2015-05-11 ENCOUNTER — Emergency Department (HOSPITAL_BASED_OUTPATIENT_CLINIC_OR_DEPARTMENT_OTHER)
Admission: EM | Admit: 2015-05-11 | Discharge: 2015-05-11 | Disposition: A | Discharge status: Home / Self Care | Payer: 59 | Attending: Emergency Medicine | Admitting: Emergency Medicine

## 2015-05-11 ENCOUNTER — Emergency Department (HOSPITAL_BASED_OUTPATIENT_CLINIC_OR_DEPARTMENT_OTHER): Payer: 59

## 2015-05-11 ENCOUNTER — Encounter (HOSPITAL_BASED_OUTPATIENT_CLINIC_OR_DEPARTMENT_OTHER): Payer: Self-pay | Admitting: Emergency Medicine

## 2015-05-11 DIAGNOSIS — Z79899 Other long term (current) drug therapy: Secondary | ICD-10-CM | POA: Diagnosis not present

## 2015-05-11 DIAGNOSIS — Z8619 Personal history of other infectious and parasitic diseases: Secondary | ICD-10-CM | POA: Diagnosis not present

## 2015-05-11 DIAGNOSIS — Z8709 Personal history of other diseases of the respiratory system: Secondary | ICD-10-CM | POA: Diagnosis not present

## 2015-05-11 DIAGNOSIS — Z87442 Personal history of urinary calculi: Secondary | ICD-10-CM | POA: Insufficient documentation

## 2015-05-11 DIAGNOSIS — Z87891 Personal history of nicotine dependence: Secondary | ICD-10-CM | POA: Diagnosis not present

## 2015-05-11 DIAGNOSIS — Z87440 Personal history of urinary (tract) infections: Secondary | ICD-10-CM | POA: Diagnosis not present

## 2015-05-11 DIAGNOSIS — Z8659 Personal history of other mental and behavioral disorders: Secondary | ICD-10-CM | POA: Diagnosis not present

## 2015-05-11 DIAGNOSIS — R079 Chest pain, unspecified: Secondary | ICD-10-CM | POA: Diagnosis not present

## 2015-05-11 LAB — CBC WITH DIFFERENTIAL/PLATELET
BASOS ABS: 0 10*3/uL (ref 0.0–0.1)
BASOS PCT: 0 % (ref 0–1)
Eosinophils Absolute: 0.2 10*3/uL (ref 0.0–0.7)
Eosinophils Relative: 3 % (ref 0–5)
HEMATOCRIT: 41.4 % (ref 36.0–46.0)
Hemoglobin: 13.3 g/dL (ref 12.0–15.0)
LYMPHS PCT: 34 % (ref 12–46)
Lymphs Abs: 2.1 10*3/uL (ref 0.7–4.0)
MCH: 27.3 pg (ref 26.0–34.0)
MCHC: 32.1 g/dL (ref 30.0–36.0)
MCV: 85 fL (ref 78.0–100.0)
Monocytes Absolute: 1.2 10*3/uL — ABNORMAL HIGH (ref 0.1–1.0)
Monocytes Relative: 18 % — ABNORMAL HIGH (ref 3–12)
NEUTROS ABS: 2.8 10*3/uL (ref 1.7–7.7)
Neutrophils Relative %: 45 % (ref 43–77)
PLATELETS: 192 10*3/uL (ref 150–400)
RBC: 4.87 MIL/uL (ref 3.87–5.11)
RDW: 13.5 % (ref 11.5–15.5)
WBC: 6.3 10*3/uL (ref 4.0–10.5)

## 2015-05-11 LAB — BASIC METABOLIC PANEL
ANION GAP: 9 (ref 5–15)
BUN: 18 mg/dL (ref 6–20)
CALCIUM: 9.8 mg/dL (ref 8.9–10.3)
CHLORIDE: 100 mmol/L — AB (ref 101–111)
CO2: 25 mmol/L (ref 22–32)
CREATININE: 0.77 mg/dL (ref 0.44–1.00)
GFR calc non Af Amer: >60 mL/min (ref >60)
Glucose, Bld: 98 mg/dL (ref 65–99)
POTASSIUM: 3.8 mmol/L (ref 3.5–5.1)
SODIUM: 134 mmol/L — AB (ref 135–145)

## 2015-05-11 LAB — TROPONIN I: Troponin I: <0.03 ng/mL (ref <0.031)

## 2015-05-11 LAB — D-DIMER, QUANTITATIVE (NOT AT ARMC)

## 2015-05-11 MED ORDER — IBUPROFEN 200 MG PO TABS: 600.0000 mg | ORAL_TABLET | Freq: Once | ORAL | Status: DC

## 2015-05-11 MED ORDER — KETOROLAC TROMETHAMINE 30 MG/ML IJ SOLN
15.0000 mg | Freq: Once | INTRAMUSCULAR | Status: AC
  Administered 2015-05-11: 15 mg via INTRAVENOUS
  Filled 2015-05-11: qty 1

## 2015-05-11 NOTE — ED Notes (Signed)
Pt in c/o central chest pain since 1630, worse on inspiration. Mild SOB. NAD in triage.

## 2015-05-11 NOTE — Discharge Instructions (Signed)

## 2015-05-11 NOTE — ED Provider Notes (Signed)
CSN: [id number]     Arrival date & time 05/11/15  1947 History   First MD Initiated Contact with Patient 05/11/15 2005     Chief Complaint  Patient presents with  . Chest Pain      HPI Patient presents with chief complaint of chest pain which started around 4:30 PM while at work.  patient states pain is pleuritic in nature.  No fever chills.  No history of cardiac or lung problems.  Patient denies nausea vomiting or diaphoresis.  No recent trauma.  No recent cough.  Patient otherwise is healthy and has no medical problems. Past Medical History  Diagnosis Date  . Seizures     in high school, unknown cause (last age 29)  . Recurrent upper respiratory infection (URI)   . Urinary tract infection   . Kidney stone     during pregnancy  . Anxiety     hx of meds  . Depression     hx of meds./PTSD  . Abnormal Pap smear   . Gonorrhea   . Trichomonas   . Genital warts    Past Surgical History  Procedure Laterality Date  . No past surgeries     History reviewed. No pertinent family history. History  Substance Use Topics  . Smoking status: Former Smoker -- 0.50 packs/day  . Smokeless tobacco: Not on file     Comment: quit with preg  . Alcohol Use: Yes     Comment: occ, none with preg   OB History    Gravida Para Term Preterm AB TAB SAB Ectopic Multiple Living   2 1 1  0 0 0 0 0 0 1     Review of Systems  All other systems reviewed and are negative  Allergies  Ivp dye  Home Medications   Prior to Admission medications   Medication Sig Start Date End Date Taking? Authorizing Provider  oseltamivir (TAMIFLU) 75 MG capsule Take 75 mg by mouth 2 (two) times daily.     Historical Provider, MD  promethazine (PHENERGAN) 25 MG tablet Take 25 mg by mouth every 6 (six) hours as needed. For nausea    Historical Provider, MD   BP 121/70 mmHg  Pulse 88  Resp 20  SpO2 100%  LMP 05/02/2015 Physical Exam  Constitutional: She is oriented to person, place, and time. She appears  well-developed and well-nourished. No distress.  HENT:  Head: Normocephalic and atraumatic.  Eyes: Pupils are equal, round, and reactive to light.  Neck: Normal range of motion.  Cardiovascular: Normal rate and intact distal pulses.   Pulmonary/Chest: No respiratory distress. She has no wheezes. She has no rales.  Abdominal: Normal appearance. She exhibits no distension. There is no tenderness. There is no rebound.  Musculoskeletal: Normal range of motion.  Neurological: She is alert and oriented to person, place, and time. No cranial nerve deficit.  Skin: Skin is warm and dry. No rash noted.  Psychiatric: She has a normal mood and affect. Her behavior is normal.  Nursing note and vitals reviewed.   ED Course  Procedures (including critical care time) Labs Review Labs Reviewed  BASIC METABOLIC PANEL - Abnormal; Notable for the following:    Sodium 134 (*)    Chloride 100 (*)    All other components within normal limits  CBC WITH DIFFERENTIAL/PLATELET - Abnormal; Notable for the following:    Monocytes Relative 18 (*)    Monocytes Absolute 1.2 (*)    All other components within normal limits  TROPONIN I  D-DIMER, QUANTITATIVE (NOT AT [facility])  TROPONIN I  TROPONIN I    Imaging Review Dg Chest 2 View  05/11/2015   CLINICAL DATA:  Left-sided chest pain with shortness of breath for 4 hours.  EXAM: CHEST  2 VIEW  COMPARISON:  02/26/2015  FINDINGS: The cardiomediastinal contours are normal. The lungs are clear. Pulmonary vasculature is normal. No consolidation, pleural effusion, or pneumothorax. No acute osseous abnormalities are seen.  IMPRESSION: No acute pulmonary process.   Electronically Signed   By: Rubye Oaks M.D.   On: 05/11/2015 21:04     EKG Interpretation   Date/Time:  Tuesday May 11 2015 20:09:23 EDT Ventricular Rate:  88 PR Interval:  158 QRS Duration: 78 QT Interval:  348 QTC Calculation: 421 R Axis:   67 Text Interpretation:  Normal sinus rhythm Early  repolarization Normal ECG  Confirmed by Radford Pax  MD, Daved Mcfann ([id number]) on 05/11/2015 8:15:38 PM      MDM   Final diagnoses:  Chest pain        Nelva Nay, MD 05/11/15 2318

## 2015-09-28 ENCOUNTER — Encounter (HOSPITAL_BASED_OUTPATIENT_CLINIC_OR_DEPARTMENT_OTHER): Payer: Self-pay

## 2015-09-28 ENCOUNTER — Emergency Department (HOSPITAL_BASED_OUTPATIENT_CLINIC_OR_DEPARTMENT_OTHER)
Admission: EM | Admit: 2015-09-28 | Discharge: 2015-09-29 | Disposition: A | Discharge status: Home / Self Care | Payer: 59 | Attending: Emergency Medicine | Admitting: Emergency Medicine

## 2015-09-28 DIAGNOSIS — Z8659 Personal history of other mental and behavioral disorders: Secondary | ICD-10-CM | POA: Insufficient documentation

## 2015-09-28 DIAGNOSIS — Z87891 Personal history of nicotine dependence: Secondary | ICD-10-CM | POA: Insufficient documentation

## 2015-09-28 DIAGNOSIS — Z87442 Personal history of urinary calculi: Secondary | ICD-10-CM | POA: Diagnosis not present

## 2015-09-28 DIAGNOSIS — R519 Headache, unspecified: Secondary | ICD-10-CM

## 2015-09-28 DIAGNOSIS — R51 Headache: Secondary | ICD-10-CM | POA: Insufficient documentation

## 2015-09-28 DIAGNOSIS — Z8709 Personal history of other diseases of the respiratory system: Secondary | ICD-10-CM | POA: Insufficient documentation

## 2015-09-28 DIAGNOSIS — Z87440 Personal history of urinary (tract) infections: Secondary | ICD-10-CM | POA: Diagnosis not present

## 2015-09-28 DIAGNOSIS — Z8619 Personal history of other infectious and parasitic diseases: Secondary | ICD-10-CM | POA: Insufficient documentation

## 2015-09-28 MED ORDER — DIPHENHYDRAMINE HCL 50 MG/ML IJ SOLN
25.0000 mg | Freq: Once | INTRAMUSCULAR | Status: AC
  Administered 2015-09-28: 25 mg via INTRAVENOUS
  Filled 2015-09-28: qty 1

## 2015-09-28 MED ORDER — METOCLOPRAMIDE HCL 5 MG/ML IJ SOLN
10.0000 mg | Freq: Once | INTRAMUSCULAR | Status: AC
  Administered 2015-09-28: 10 mg via INTRAVENOUS
  Filled 2015-09-28: qty 2

## 2015-09-28 MED ORDER — KETOROLAC TROMETHAMINE 15 MG/ML IJ SOLN
15.0000 mg | Freq: Once | INTRAMUSCULAR | Status: AC
  Administered 2015-09-28: 15 mg via INTRAVENOUS
  Filled 2015-09-28: qty 1

## 2015-09-28 MED ORDER — ONDANSETRON HCL 4 MG/2ML IJ SOLN
4.0000 mg | Freq: Once | INTRAMUSCULAR | Status: AC
  Administered 2015-09-28: 4 mg via INTRAVENOUS
  Filled 2015-09-28: qty 2

## 2015-09-28 NOTE — ED Notes (Signed)
C/o migraine,n/v since 4pm

## 2015-09-28 NOTE — ED Notes (Signed)
MD at bedside. 

## 2015-09-28 NOTE — ED Provider Notes (Signed)
CSN: [id number]     Arrival date & time 09/28/15  2142 History  By signing my name below, I, Gwenyth Oberatherine Macek, attest that this documentation has been prepared under the direction and in the presence of Paula LibraJohn Dieter Hane, MD.  Electronically Signed: Gwenyth Oberatherine Macek, ED Scribe. 09/28/2015. 11:38 PM.   Chief Complaint  Patient presents with  . Migraine   The history is provided by the patient. No language interpreter was used.    HPI Comments: Christine Barnes is a 29 y.o. female with a history of migraines who presents to the Emergency Department complaining of an 8/10, bilateral, throbbing biparietal (R>L) HA that started 7 hours ago. She reports nausea, vomiting and light-headedness as associated symptoms. Pt was administered IV fluids and Zofran in the ED prior to my evaluation with relief to her nausea but not the headache. She denies blurred vision but has had lightheadedness. Her headache is worse with exposure to light.  Past Medical History  Diagnosis Date  . Seizures (HCC)     in high school, unknown cause (last age 29)  . Recurrent upper respiratory infection (URI)   . Urinary tract infection   . Kidney stone     during pregnancy  . Anxiety     hx of meds  . Depression     hx of meds./PTSD  . Abnormal Pap smear   . Gonorrhea   . Trichomonas   . Genital warts    Past Surgical History  Procedure Laterality Date  . No past surgeries     No family history on file. Social History  Substance Use Topics  . Smoking status: Former Smoker -- 0.50 packs/day  . Smokeless tobacco: None     Comment: quit with preg  . Alcohol Use: Yes     Comment: occ   OB History    Gravida Para Term Preterm AB TAB SAB Ectopic Multiple Living   2 1 1  0 0 0 0 0 0 1     Review of Systems 10 Systems reviewed and all are negative for acute change except as noted in the HPI.  Allergies  Ivp dye  Home Medications   Prior to Admission medications   Not on File   BP 144/90 mmHg  Pulse 78   Temp(Src) 98.3 F (36.8 C) (Oral)  Resp 18  Ht 5\' 7"  (1.702 m)  Wt 155 lb (70.308 kg)  BMI 24.27 kg/m2  SpO2 100%  LMP 09/22/2015   Physical Exam General: Well-developed, well-nourished female in no acute distress; appearance consistent with age of record HENT: normocephalic; atraumatic Eyes: pupils equal, round and reactive to light; extraocular muscles intact Neck: supple Heart: regular rate and rhythm Lungs: clear to auscultation bilaterally Abdomen: soft; nondistended; nontender; bowel sounds present Extremities: No deformity; full range of motion; pulses normal Neurologic: Awake, alert and oriented; motor function intact in all extremities and symmetric; no facial droop Skin: Warm and dry Psychiatric: Flat affect  ED Course  Procedures  DIAGNOSTIC STUDIES: Oxygen Saturation is 100% on RA, normal by my interpretation.    COORDINATION OF CARE: 11:38 PM Discussed treatment plan with pt. Pt agreed to plan.    MDM  12:28 AM Headache resolved with IV medications.  I personally performed the services described in this documentation, which was scribed in my presence. The recorded information has been reviewed and is accurate.   Paula LibraJohn Caprice Mccaffrey, MD 09/29/15 0030

## 2015-09-29 NOTE — ED Notes (Signed)
MD at bedside. 

## 2016-03-03 ENCOUNTER — Emergency Department (HOSPITAL_BASED_OUTPATIENT_CLINIC_OR_DEPARTMENT_OTHER)
Admission: EM | Admit: 2016-03-03 | Discharge: 2016-03-03 | Disposition: A | Discharge status: Home / Self Care | Payer: 59 | Attending: Emergency Medicine | Admitting: Emergency Medicine

## 2016-03-03 ENCOUNTER — Encounter (HOSPITAL_BASED_OUTPATIENT_CLINIC_OR_DEPARTMENT_OTHER): Payer: Self-pay | Admitting: *Deleted

## 2016-03-03 DIAGNOSIS — Z8619 Personal history of other infectious and parasitic diseases: Secondary | ICD-10-CM | POA: Insufficient documentation

## 2016-03-03 DIAGNOSIS — Z87891 Personal history of nicotine dependence: Secondary | ICD-10-CM | POA: Diagnosis not present

## 2016-03-03 DIAGNOSIS — Z87442 Personal history of urinary calculi: Secondary | ICD-10-CM | POA: Diagnosis not present

## 2016-03-03 DIAGNOSIS — Z8659 Personal history of other mental and behavioral disorders: Secondary | ICD-10-CM | POA: Insufficient documentation

## 2016-03-03 DIAGNOSIS — Z87440 Personal history of urinary (tract) infections: Secondary | ICD-10-CM | POA: Diagnosis not present

## 2016-03-03 DIAGNOSIS — R0789 Other chest pain: Secondary | ICD-10-CM | POA: Insufficient documentation

## 2016-03-03 DIAGNOSIS — Z8709 Personal history of other diseases of the respiratory system: Secondary | ICD-10-CM | POA: Insufficient documentation

## 2016-03-03 DIAGNOSIS — R079 Chest pain, unspecified: Secondary | ICD-10-CM | POA: Diagnosis present

## 2016-03-03 DIAGNOSIS — R0781 Pleurodynia: Secondary | ICD-10-CM

## 2016-03-03 NOTE — Discharge Instructions (Signed)
Please follow-up with her primary care provider in one week for reevaluation further management. Please return immediately if any new or worsening signs or symptoms present. Please follow-up with OB/GYN for further evaluation and management of recurrent pregnancy.
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
## 2016-03-03 NOTE — ED Notes (Signed)
Pt verbalizes understanding of d/c instructions and denies any further needs at this time. 

## 2016-07-17 ENCOUNTER — Encounter (HOSPITAL_BASED_OUTPATIENT_CLINIC_OR_DEPARTMENT_OTHER): Payer: Self-pay | Admitting: *Deleted

## 2016-07-17 ENCOUNTER — Emergency Department (HOSPITAL_BASED_OUTPATIENT_CLINIC_OR_DEPARTMENT_OTHER)
Admission: EM | Admit: 2016-07-17 | Discharge: 2016-07-17 | Disposition: A | Discharge status: Home / Self Care | Payer: 59 | Attending: Emergency Medicine | Admitting: Emergency Medicine

## 2016-07-17 DIAGNOSIS — O26891 Other specified pregnancy related conditions, first trimester: Secondary | ICD-10-CM | POA: Diagnosis not present

## 2016-07-17 DIAGNOSIS — O219 Vomiting of pregnancy, unspecified: Secondary | ICD-10-CM | POA: Diagnosis not present

## 2016-07-17 DIAGNOSIS — Z3A11 11 weeks gestation of pregnancy: Secondary | ICD-10-CM | POA: Diagnosis not present

## 2016-07-17 DIAGNOSIS — R51 Headache: Secondary | ICD-10-CM | POA: Insufficient documentation

## 2016-07-17 DIAGNOSIS — Z87891 Personal history of nicotine dependence: Secondary | ICD-10-CM | POA: Diagnosis not present

## 2016-07-17 DIAGNOSIS — R112 Nausea with vomiting, unspecified: Secondary | ICD-10-CM

## 2016-07-17 LAB — COMPREHENSIVE METABOLIC PANEL
ALBUMIN: 3.8 g/dL (ref 3.5–5.0)
ALT: 16 U/L (ref 14–54)
AST: 21 U/L (ref 15–41)
Alkaline Phosphatase: 51 U/L (ref 38–126)
Anion gap: 4 — ABNORMAL LOW (ref 5–15)
BUN: 8 mg/dL (ref 6–20)
CHLORIDE: 104 mmol/L (ref 101–111)
CO2: 27 mmol/L (ref 22–32)
CREATININE: 0.55 mg/dL (ref 0.44–1.00)
Calcium: 8.9 mg/dL (ref 8.9–10.3)
GFR calc Af Amer: >60 mL/min (ref >60)
GFR calc non Af Amer: >60 mL/min (ref >60)
GLUCOSE: 83 mg/dL (ref 65–99)
POTASSIUM: 3.4 mmol/L — AB (ref 3.5–5.1)
SODIUM: 135 mmol/L (ref 135–145)
Total Bilirubin: 0.7 mg/dL (ref 0.3–1.2)
Total Protein: 7 g/dL (ref 6.5–8.1)

## 2016-07-17 LAB — URINALYSIS, ROUTINE W REFLEX MICROSCOPIC
BILIRUBIN URINE: NEGATIVE
GLUCOSE, UA: NEGATIVE mg/dL
HGB URINE DIPSTICK: NEGATIVE
Ketones, ur: 15 mg/dL — AB
Leukocytes, UA: NEGATIVE
Nitrite: NEGATIVE
PH: 7 (ref 5.0–8.0)
Protein, ur: NEGATIVE mg/dL
SPECIFIC GRAVITY, URINE: 1.031 — AB (ref 1.005–1.030)

## 2016-07-17 LAB — CBC WITH DIFFERENTIAL/PLATELET
BASOS ABS: 0 10*3/uL (ref 0.0–0.1)
BASOS PCT: 0 %
EOS PCT: 1 %
Eosinophils Absolute: 0.1 10*3/uL (ref 0.0–0.7)
HCT: 37.8 % (ref 36.0–46.0)
Hemoglobin: 12.7 g/dL (ref 12.0–15.0)
LYMPHS PCT: 19 %
Lymphs Abs: 1.5 10*3/uL (ref 0.7–4.0)
MCH: 28.9 pg (ref 26.0–34.0)
MCHC: 33.6 g/dL (ref 30.0–36.0)
MCV: 85.9 fL (ref 78.0–100.0)
Monocytes Absolute: 1 10*3/uL (ref 0.1–1.0)
Monocytes Relative: 13 %
Neutro Abs: 5.3 10*3/uL (ref 1.7–7.7)
Neutrophils Relative %: 67 %
PLATELETS: 179 10*3/uL (ref 150–400)
RBC: 4.4 MIL/uL (ref 3.87–5.11)
RDW: 14 % (ref 11.5–15.5)
WBC: 7.9 10*3/uL (ref 4.0–10.5)

## 2016-07-17 MED ORDER — METOCLOPRAMIDE HCL 5 MG/ML IJ SOLN
10.0000 mg | Freq: Once | INTRAMUSCULAR | Status: AC
  Administered 2016-07-17: 10 mg via INTRAVENOUS
  Filled 2016-07-17: qty 2

## 2016-07-17 MED ORDER — DEXTROSE-NACL 5-0.45 % IV SOLN: INTRAVENOUS | Status: DC

## 2016-07-17 MED ORDER — PYRIDOXINE HCL 100 MG/ML IJ SOLN
100.0000 mg | Freq: Once | INTRAMUSCULAR | Status: AC
  Administered 2016-07-17: 100 mg via INTRAVENOUS
  Filled 2016-07-17: qty 1

## 2016-07-17 MED ORDER — ACETAMINOPHEN 500 MG PO TABS
1000.0000 mg | ORAL_TABLET | Freq: Once | ORAL | Status: AC
  Administered 2016-07-17: 1000 mg via ORAL
  Filled 2016-07-17: qty 2

## 2016-07-17 MED ORDER — ACETAMINOPHEN 500 MG PO TABS: 1000.0000 mg | ORAL_TABLET | Freq: Once | ORAL | Status: DC

## 2016-07-17 MED ORDER — PROMETHAZINE HCL 25 MG PO TABS: 25.0000 mg | ORAL_TABLET | Freq: Four times a day (QID) | ORAL | 0 refills | Status: DC | PRN

## 2016-07-17 MED ORDER — VITAMIN B-6 100 MG PO TABS
100.0000 mg | ORAL_TABLET | Freq: Every day | ORAL | Status: DC
  Filled 2016-07-17: qty 1

## 2016-07-17 MED ORDER — PROMETHAZINE HCL 25 MG/ML IJ SOLN
25.0000 mg | Freq: Once | INTRAMUSCULAR | Status: AC
  Administered 2016-07-17: 25 mg via INTRAVENOUS
  Filled 2016-07-17: qty 1

## 2016-07-17 MED ORDER — METOCLOPRAMIDE HCL 10 MG PO TABS
10.0000 mg | ORAL_TABLET | Freq: Once | ORAL | Status: DC
Start: 2016-07-17 — End: 2016-07-17

## 2016-07-17 NOTE — ED Triage Notes (Signed)
Pt reports nausea and vomiting x yesterday. Denies any vag d/c, bleeding, pain or other pregnancy related c/o, pt states "I just feel weak from not eating."

## 2016-07-17 NOTE — ED Notes (Signed)
Pt unable to give UA at this time 

## 2016-07-17 NOTE — ED Provider Notes (Signed)
[facility]   CSN: [id number] Arrival date & time: 07/17/16  [id number]  First Provider Contact:  First MD Initiated Contact with Patient 07/17/16 [phone]        History   Chief Complaint Chief Complaint  Patient presents with  . Other    pregnant/vomiting    HPI Christine Fischeranisha N Garris is a 30 y.o. female Complaint of headache, nausea and vomiting. She is [redacted] weeks pregnant. Patient states that she has had mild nausea throughout her pregnancy. However, she started vomiting yesterday and has been unable to hold down anything by mouth. She has a has a history of migraine headaches and complains of a migraine headache at this time. Describes it as throbbing, left-sided. She had some scintillations this morning. She denies abdominal pain, diarrhea.   HPI  Past Medical History:  Diagnosis Date  . Abnormal Pap smear   . Anxiety    hx of meds  . Depression    hx of meds./PTSD  . Genital warts   . Gonorrhea   . Kidney stone    during pregnancy  . Recurrent upper respiratory infection (URI)   . Seizures (HCC)    in high school, unknown cause (last age 30)  . Trichomonas   . Urinary tract infection     There are no active problems to display for this patient.   Past Surgical History:  Procedure Laterality Date  . NO PAST SURGERIES      OB History    Gravida Para Term Preterm AB Living   3 1 1  0 0 1   SAB TAB Ectopic Multiple Live Births   0 0 0 0 1       Home Medications    Prior to Admission medications   Not on File    Family History History reviewed. No pertinent family history.  Social History Social History  Substance Use Topics  . Smoking status: Former Smoker    Packs/day: 0.50  . Smokeless tobacco: Never Used     Comment: quit with preg  . Alcohol use Yes     Comment: occ     Allergies   Ivp dye [iodinated diagnostic agents]   Review of Systems Review of Systems  Ten systems reviewed and are negative for acute change,  except as noted in the HPI.   Physical Exam Updated Vital Signs BP 119/80 (BP Location: Left Arm)   Pulse 88   Temp 98.8 F (37.1 C) (Oral)   Resp 18   Ht 5\' 7"  (1.702 m)   Wt 72.6 kg   LMP 09/22/2015   SpO2 100%   BMI 25.06 kg/m   Physical Exam Physical Exam  Constitutional: She is oriented to person, place, and time. She appears well-developed and well-nourished. No distress.  HENT:  Head: Normocephalic and atraumatic.  Eyes: Conjunctivae are normal. No scleral icterus.  Neck: Normal range of motion.  Cardiovascular: Normal rate, regular rhythm and normal heart sounds.  Exam reveals no gallop and no friction rub.   No murmur heard. Pulmonary/Chest: Effort normal and breath sounds normal. No respiratory distress. She exhibits no tenderness.  Abdominal: Soft. Bowel sounds are normal. She exhibits no distension and no mass. There is no tenderness. There is no guarding.  Neurological: She is alert and oriented to person, place, and time.  Speech is clear and goal oriented, follows commands Major Cranial nerves without deficit, no facial droop Normal strength in upper and lower extremities bilaterally including dorsiflexion and plantar  flexion, strong and equal grip strength Sensation normal to light and sharp touch Moves extremities without ataxia, coordination intact Normal finger to nose and rapid alternating movements Neg romberg, no pronator drift Normal gait Normal heel-shin and balance   Skin: Skin is warm and dry. She is not diaphoretic.  Nursing note and vitals reviewed.   ED Treatments / Results  Labs (all labs ordered are listed, but only abnormal results are displayed) Labs Reviewed  CBC WITH DIFFERENTIAL/PLATELET  COMPREHENSIVE METABOLIC PANEL  URINALYSIS, ROUTINE W REFLEX MICROSCOPIC (NOT AT [facility])    EKG  EKG Interpretation None       Radiology No results found.  Procedures Procedures (including critical care time)  Medications Ordered in  ED Medications  metoCLOPramide (REGLAN) injection 10 mg (10 mg Intravenous Given 07/17/16 0936)  pyridOXINE (B-6) injection 100 mg (100 mg Intravenous Given 07/17/16 0936)  acetaminophen (TYLENOL) tablet 1,000 mg (1,000 mg Oral Given 07/17/16 0936)     Initial Impression / Assessment and Plan / ED Course  I have reviewed the triage vital signs and the nursing notes.  Pertinent labs & imaging results that were available during my care of the patient were reviewed by me and considered in my medical decision making (see chart for details).  Clinical Course  Comment By Time  LMP 05/04/2016 Arthor Captain, PA-C 08/07 7829    Patient headache resolved with Tylenol, medications given in the ED listed above. Patient has improved significantly with Phenergan, fluids. She is a new multiple packs of crackers without vomiting. Patient was given a small amount of Phenergan to go home with. Advised patient to follow-up with her OB/GYN as soon as possible. She was safe for discharge at this time, lab work reviewed without significant abnormalities. Final Clinical Impressions(s) / ED Diagnoses   Final diagnoses:  None    New Prescriptions New Prescriptions   No medications on file     Arthor Captain, PA-C 07/17/16 1656    Geoffery Lyons, MD 07/18/16 [phone]

## 2016-07-19 ENCOUNTER — Telehealth (HOSPITAL_BASED_OUTPATIENT_CLINIC_OR_DEPARTMENT_OTHER): Payer: Self-pay | Admitting: *Deleted

## 2016-07-19 NOTE — Telephone Encounter (Signed)
Pt seen Monday. Requesting note to return to work today. Chart reviewed by Arthor CaptainAbigail Harris, PA. Note provided to return to work 07/19/2016 per [facility]. Pt advised she can pick note up from registration

## 2018-06-30 ENCOUNTER — Emergency Department (HOSPITAL_BASED_OUTPATIENT_CLINIC_OR_DEPARTMENT_OTHER)
Admission: EM | Admit: 2018-06-30 | Discharge: 2018-07-01 | Disposition: A | Discharge status: Home / Self Care | Payer: No Typology Code available for payment source | Attending: Emergency Medicine | Admitting: Emergency Medicine

## 2018-06-30 ENCOUNTER — Encounter (HOSPITAL_BASED_OUTPATIENT_CLINIC_OR_DEPARTMENT_OTHER): Payer: Self-pay | Admitting: *Deleted

## 2018-06-30 ENCOUNTER — Other Ambulatory Visit: Payer: Self-pay

## 2018-06-30 DIAGNOSIS — Y92410 Unspecified street and highway as the place of occurrence of the external cause: Secondary | ICD-10-CM | POA: Insufficient documentation

## 2018-06-30 DIAGNOSIS — Y999 Unspecified external cause status: Secondary | ICD-10-CM | POA: Diagnosis not present

## 2018-06-30 DIAGNOSIS — Z87891 Personal history of nicotine dependence: Secondary | ICD-10-CM | POA: Insufficient documentation

## 2018-06-30 DIAGNOSIS — Y9389 Activity, other specified: Secondary | ICD-10-CM | POA: Insufficient documentation

## 2018-06-30 DIAGNOSIS — S8012XA Contusion of left lower leg, initial encounter: Secondary | ICD-10-CM | POA: Diagnosis not present

## 2018-06-30 DIAGNOSIS — S8982XA Other specified injuries of left lower leg, initial encounter: Secondary | ICD-10-CM | POA: Diagnosis present
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
## 2018-07-01 NOTE — ED Notes (Signed)
Pt given d/c instructions as per chart. Verbalizes understanding. No questions. 
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
## 2018-07-01 NOTE — ED Notes (Signed)
ED Provider at bedside. 
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
## 2018-08-31 ENCOUNTER — Encounter (HOSPITAL_BASED_OUTPATIENT_CLINIC_OR_DEPARTMENT_OTHER): Payer: Self-pay | Admitting: Emergency Medicine

## 2018-08-31 ENCOUNTER — Emergency Department (HOSPITAL_BASED_OUTPATIENT_CLINIC_OR_DEPARTMENT_OTHER): Payer: 59

## 2018-08-31 ENCOUNTER — Other Ambulatory Visit: Payer: Self-pay

## 2018-08-31 ENCOUNTER — Emergency Department (HOSPITAL_BASED_OUTPATIENT_CLINIC_OR_DEPARTMENT_OTHER)
Admission: EM | Admit: 2018-08-31 | Discharge: 2018-08-31 | Disposition: A | Discharge status: Home / Self Care | Payer: 59 | Attending: Emergency Medicine | Admitting: Emergency Medicine

## 2018-08-31 DIAGNOSIS — Y999 Unspecified external cause status: Secondary | ICD-10-CM | POA: Diagnosis not present

## 2018-08-31 DIAGNOSIS — S60221A Contusion of right hand, initial encounter: Secondary | ICD-10-CM | POA: Insufficient documentation

## 2018-08-31 DIAGNOSIS — W2209XA Striking against other stationary object, initial encounter: Secondary | ICD-10-CM | POA: Insufficient documentation

## 2018-08-31 DIAGNOSIS — Y92009 Unspecified place in unspecified non-institutional (private) residence as the place of occurrence of the external cause: Secondary | ICD-10-CM | POA: Insufficient documentation

## 2018-08-31 DIAGNOSIS — Y939 Activity, unspecified: Secondary | ICD-10-CM | POA: Insufficient documentation

## 2018-08-31 DIAGNOSIS — Z87891 Personal history of nicotine dependence: Secondary | ICD-10-CM | POA: Diagnosis not present

## 2018-08-31 MED ORDER — IBUPROFEN 400 MG PO TABS
600.0000 mg | ORAL_TABLET | Freq: Once | ORAL | Status: AC
  Administered 2018-08-31: 600 mg via ORAL
  Filled 2018-08-31: qty 1

## 2018-08-31 NOTE — ED Triage Notes (Signed)
Patient states that she hit her hand on a wall yesterday  - patient has pain to her right hand

## 2018-08-31 NOTE — ED Provider Notes (Signed)
[facility] EMERGENCY DEPARTMENT Provider Note   CSN: [id number] Arrival date & time: 08/31/18  1310     History   Chief Complaint Chief Complaint  Patient presents with  . Hand Injury    HPI Christine Barnes is a 32 y.o. female.  HPI  32 year old female presents with right hand pain.  She tripped over her children's toys and fell to the ground.  She was angry and then punched a wall.  She is having ulnar hand pain and some swelling.  Has not taken anything for this.  No weakness or numbness.  Has pain when trying to close her fist.  No wrist pain. Pain is rated as an 8/10.  Past Medical History:  Diagnosis Date  . Abnormal Pap smear   . Anxiety    hx of meds  . Depression    hx of meds./PTSD  . Genital warts   . Gonorrhea   . Kidney stone    during pregnancy  . Recurrent upper respiratory infection (URI)   . Seizures (HCC)    in high school, unknown cause (last age 32)  . Trichomonas   . Urinary tract infection     There are no active problems to display for this patient.   Past Surgical History:  Procedure Laterality Date  . NO PAST SURGERIES    . TUBAL LIGATION       OB History    Gravida  3   Para  1   Term  1   Preterm  0   AB  0   Living  1     SAB  0   TAB  0   Ectopic  0   Multiple  0   Live Births  1            Home Medications    Prior to Admission medications   Medication Sig Start Date End Date Taking? Authorizing Provider  promethazine (PHENERGAN) 25 MG tablet Take 1 tablet (25 mg total) by mouth every 6 (six) hours as needed for nausea or vomiting. 07/17/16   Arthor CaptainHarris, Abigail, PA-C    Family History History reviewed. No pertinent family history.  Social History Social History   Tobacco Use  . Smoking status: Former Smoker    Packs/day: 0.50  . Smokeless tobacco: Never Used  . Tobacco comment: quit with preg  Substance Use Topics  . Alcohol use: Yes    Comment: occ  . Drug use: No      Allergies   Ioxaglate; Ivp dye [iodinated diagnostic agents]; and Metrizamide   Review of Systems Review of Systems  Musculoskeletal: Positive for arthralgias and joint swelling.  Neurological: Negative for weakness and numbness.     Physical Exam Updated Vital Signs BP 118/78 (BP Location: Left Arm)   Pulse 93   Temp 98.7 F (37.1 C) (Oral)   Resp 18   Ht 5\' 7"  (1.702 m)   Wt 66.2 kg   LMP 08/11/2018   SpO2 99%   BMI 22.87 kg/m   Physical Exam  Constitutional: She appears well-developed and well-nourished. No distress.  HENT:  Head: Normocephalic and atraumatic.  Eyes: Right eye exhibits no discharge. Left eye exhibits no discharge.  Cardiovascular: Normal rate and regular rhythm.  Pulses:      Radial pulses are 2+ on the right side.  Pulmonary/Chest: Effort normal.  Abdominal: She exhibits no distension.  Musculoskeletal:       Right wrist: She exhibits normal range  of motion, no tenderness, no bony tenderness and no swelling.       Right hand: She exhibits tenderness and swelling. Normal sensation noted.       Hands: Neurological: She is alert.  Skin: Skin is warm and dry. She is not diaphoretic. No erythema.  Nursing note and vitals reviewed.    ED Treatments / Results  Labs (all labs ordered are listed, but only abnormal results are displayed) Labs Reviewed - No data to display  EKG None  Radiology Dg Hand Complete Right  Result Date: 08/31/2018 CLINICAL DATA:  Hit right hand on a wall. Pain around the fifth metacarpal area. EXAM: RIGHT HAND - COMPLETE 3+ VIEW COMPARISON:  None. FINDINGS: There is no evidence of fracture or dislocation. There is no evidence of arthropathy or other focal bone abnormality. Soft tissues are unremarkable. IMPRESSION: Negative. Electronically Signed   By: Amie Portland M.D.   On: 08/31/2018 13:33    Procedures Procedures (including critical care time)  Medications Ordered in ED Medications  ibuprofen  (ADVIL,MOTRIN) tablet 600 mg (has no administration in time range)     Initial Impression / Assessment and Plan / ED Course  I have reviewed the triage vital signs and the nursing notes.  Pertinent labs & imaging results that were available during my care of the patient were reviewed by me and considered in my medical decision making (see chart for details).     X-rays negative.  No scaphoid tenderness.  Suspicion for an occult fracture is pretty low.  Ibuprofen, Tylenol, and ice.  Consistent with a hand contusion.  Final Clinical Impressions(s) / ED Diagnoses   Final diagnoses:  Contusion of right hand, initial encounter    ED Discharge Orders    None       Pricilla Loveless, MD 08/31/18 1348

## 2018-08-31 NOTE — ED Notes (Signed)
Pt/family verbalized understanding of discharge instructions.   

## 2019-03-07 ENCOUNTER — Emergency Department (HOSPITAL_BASED_OUTPATIENT_CLINIC_OR_DEPARTMENT_OTHER): Payer: 59

## 2019-03-07 ENCOUNTER — Other Ambulatory Visit: Payer: Self-pay

## 2019-03-07 ENCOUNTER — Encounter (HOSPITAL_BASED_OUTPATIENT_CLINIC_OR_DEPARTMENT_OTHER): Payer: Self-pay

## 2019-03-07 ENCOUNTER — Emergency Department (HOSPITAL_BASED_OUTPATIENT_CLINIC_OR_DEPARTMENT_OTHER)
Admission: EM | Admit: 2019-03-07 | Discharge: 2019-03-07 | Disposition: A | Discharge status: Home / Self Care | Payer: 59 | Attending: Emergency Medicine | Admitting: Emergency Medicine

## 2019-03-07 DIAGNOSIS — Z87891 Personal history of nicotine dependence: Secondary | ICD-10-CM | POA: Diagnosis not present

## 2019-03-07 DIAGNOSIS — G43809 Other migraine, not intractable, without status migrainosus: Secondary | ICD-10-CM | POA: Insufficient documentation

## 2019-03-07 DIAGNOSIS — R51 Headache: Secondary | ICD-10-CM | POA: Diagnosis present

## 2019-03-07 LAB — PREGNANCY, URINE: Preg Test, Ur: NEGATIVE

## 2019-03-07 MED ORDER — ONDANSETRON HCL 4 MG/2ML IJ SOLN
4.0000 mg | Freq: Once | INTRAMUSCULAR | Status: AC
  Administered 2019-03-07: 4 mg via INTRAVENOUS
  Filled 2019-03-07: qty 2

## 2019-03-07 MED ORDER — KETOROLAC TROMETHAMINE 30 MG/ML IJ SOLN
30.0000 mg | Freq: Once | INTRAMUSCULAR | Status: AC
  Administered 2019-03-07: 30 mg via INTRAVENOUS
  Filled 2019-03-07: qty 1

## 2019-03-07 MED ORDER — METOCLOPRAMIDE HCL 5 MG/ML IJ SOLN
10.0000 mg | Freq: Once | INTRAMUSCULAR | Status: AC
Start: 2019-03-07 — End: 2019-03-07
  Administered 2019-03-07: 10 mg via INTRAVENOUS
  Filled 2019-03-07: qty 2

## 2019-03-07 MED ORDER — SODIUM CHLORIDE 0.9 % IV BOLUS
1000.0000 mL | Freq: Once | INTRAVENOUS | Status: AC
  Administered 2019-03-07: 1000 mL via INTRAVENOUS

## 2019-03-07 MED ORDER — DIPHENHYDRAMINE HCL 50 MG/ML IJ SOLN
25.0000 mg | Freq: Once | INTRAMUSCULAR | Status: AC
  Administered 2019-03-07: 25 mg via INTRAVENOUS
  Filled 2019-03-07: qty 1

## 2019-03-07 NOTE — ED Notes (Signed)
Pt provided Sprite per EDP orders, comfort measures also provided, updated on plan of care

## 2019-03-07 NOTE — ED Notes (Signed)
Pt. Left for radiology as soon as IV started.  RN informed room RN that the IV was placed and that the Pt. Has gone to Radiology

## 2019-03-07 NOTE — ED Triage Notes (Signed)
Nasal congestion for two days with headache and vomiting today. States that headaches sometimes make her nauseated. Pt states she "just don't feel good," after yelling at nurse that she already told registration what was wrong.
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
## 2019-03-07 NOTE — Discharge Instructions (Signed)
Follow-up with referred neurology office.  Call their office and arrange for an appointment.  Return emergency department for any worsening headache, fever, vision changes, vomiting or any other worsening or concerning symptoms.

## 2020-08-23 IMAGING — CT CT HEAD WITHOUT CONTRAST
3 series · 16 of 47 positions shown, 19 images · non-contrast
Comparison: None.

CLINICAL DATA: Headache, nausea, vomiting, and photosensitivity.
History of migraines.

EXAM:
CT HEAD WITHOUT CONTRAST
TECHNIQUE: Contiguous axial images were obtained from the base of the skull
through the vertex without intravenous contrast.

[Series 2: head wo · axial · 0.44mm/px · z∈[-102,+34]mm · 10 of 33 slices shown, 13 images]
[im 3/33  brain]
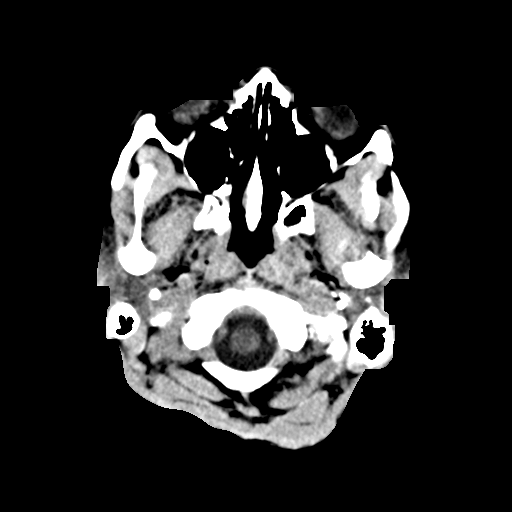
[im 3/33  bone]
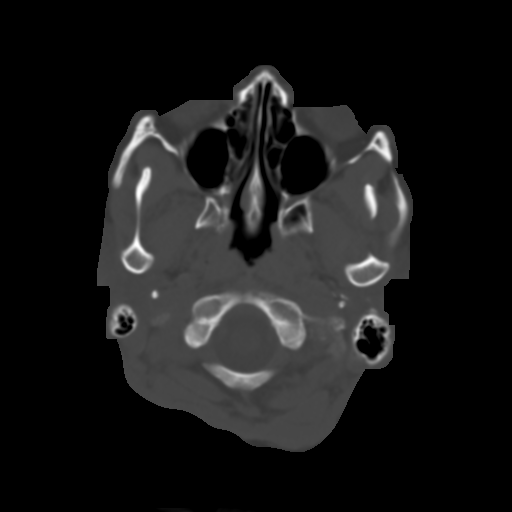
[im 6/33  brain]
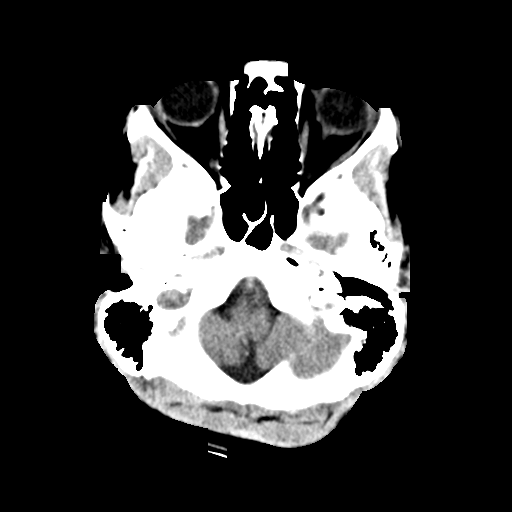
[im 9/33  brain]
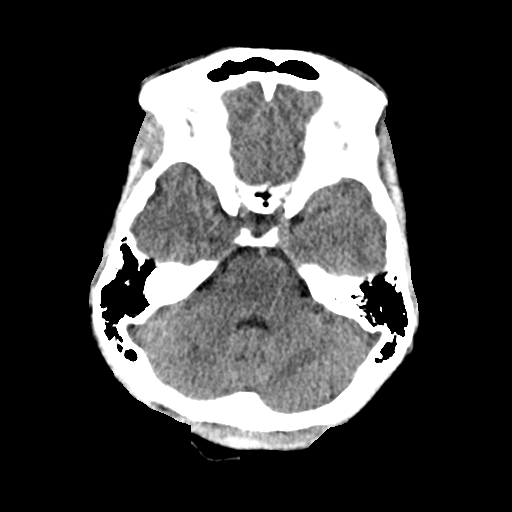
[im 12/33  brain]
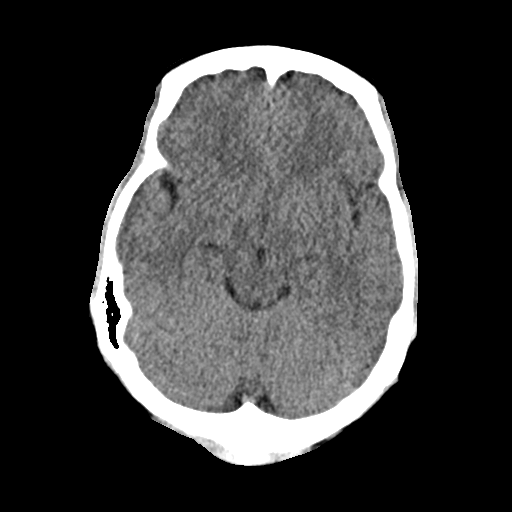
[im 15/33  brain]
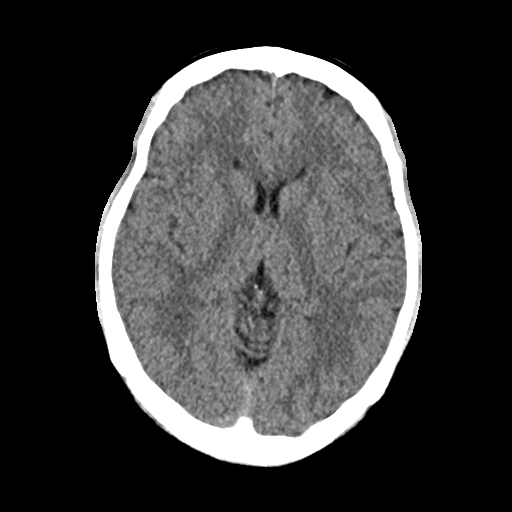
[im 15/33  bone]
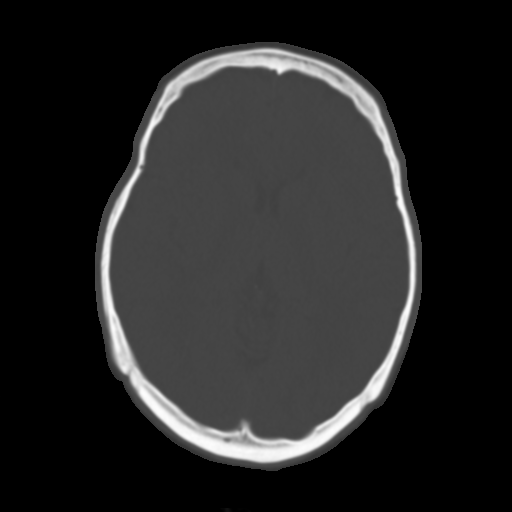
[im 18/33  brain]
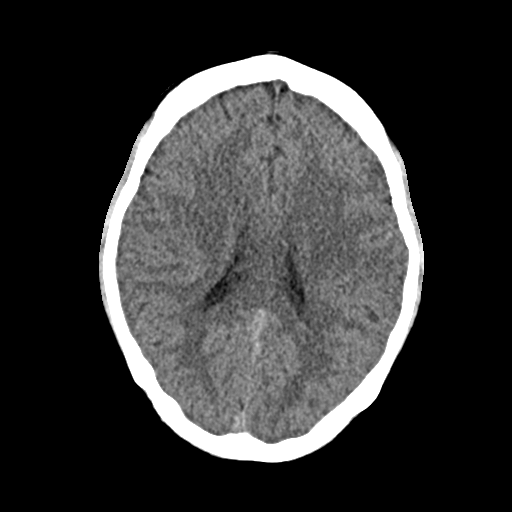
[im 21/33  brain]
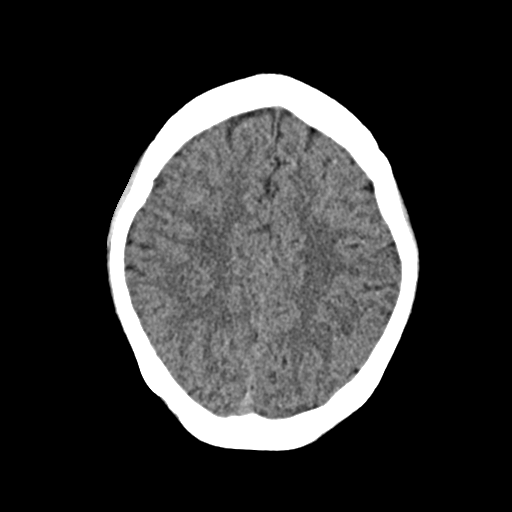
[im 25/33  brain]
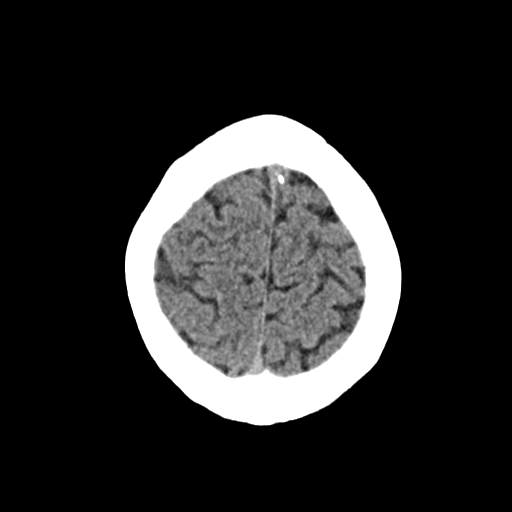
[im 27/33  brain]
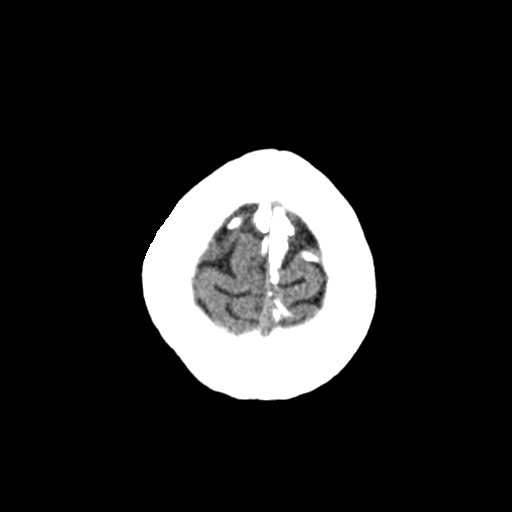
[im 27/33  bone]
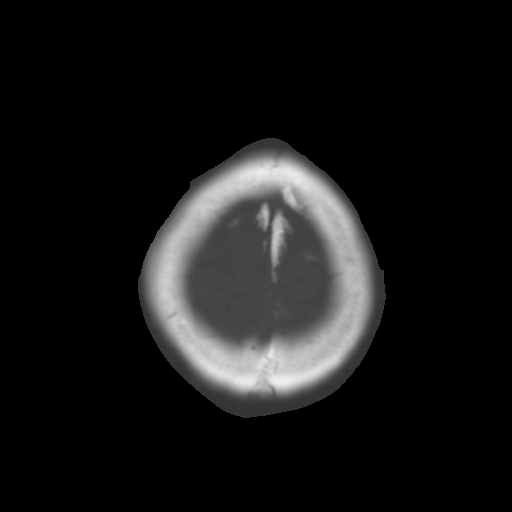
[im 30/33  brain]
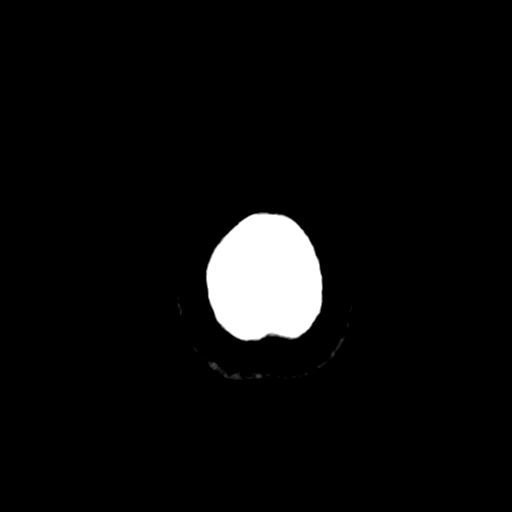

[Series 4: coronal soft · coronal · 0.34mm/px · 3 of 70 slices shown]
[im 24/70  brain]
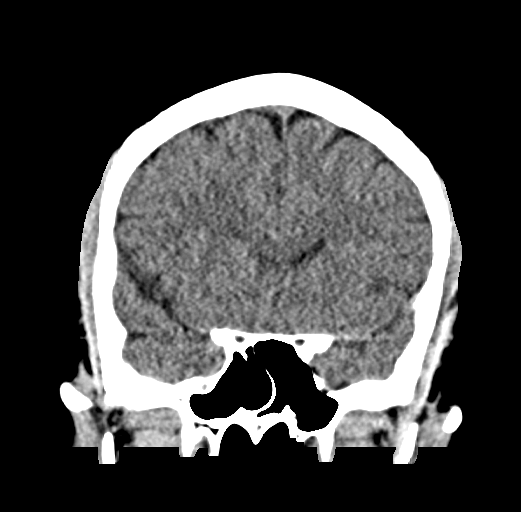
[im 31/70  brain]
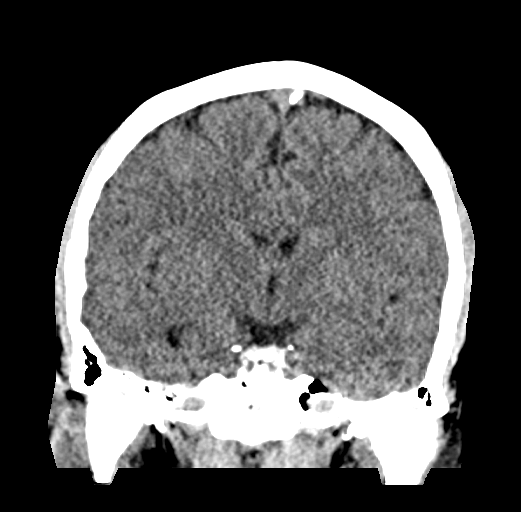
[im 39/70  brain]
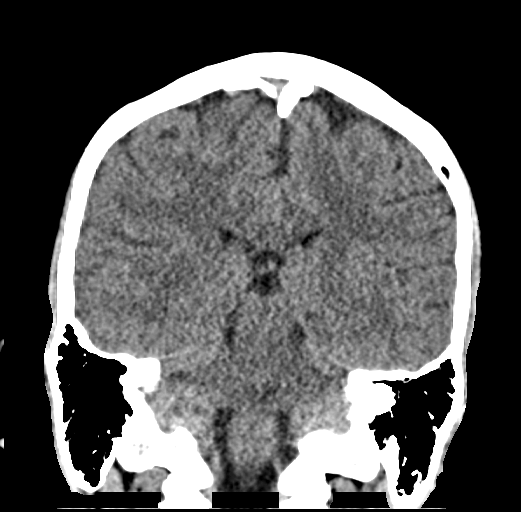

[Series 5: sag soft · sagittal · 0.34mm/px · 3 of 59 slices shown]
[im 20/59  brain]
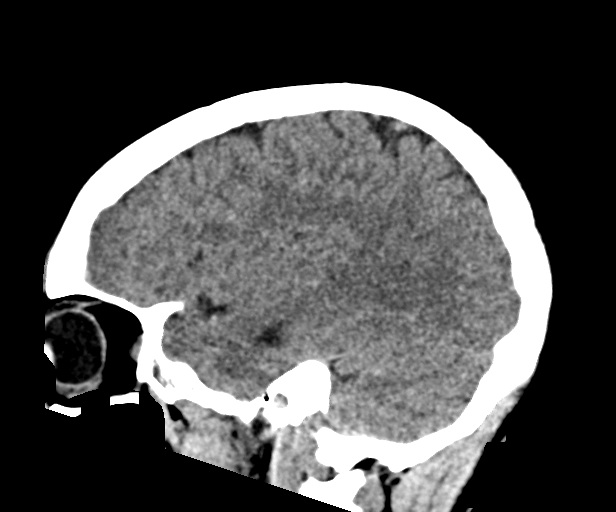
[im 30/59  brain]
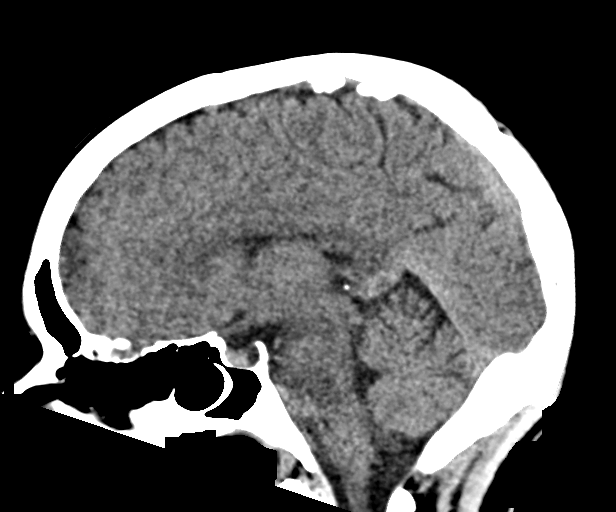
[im 39/59  brain]
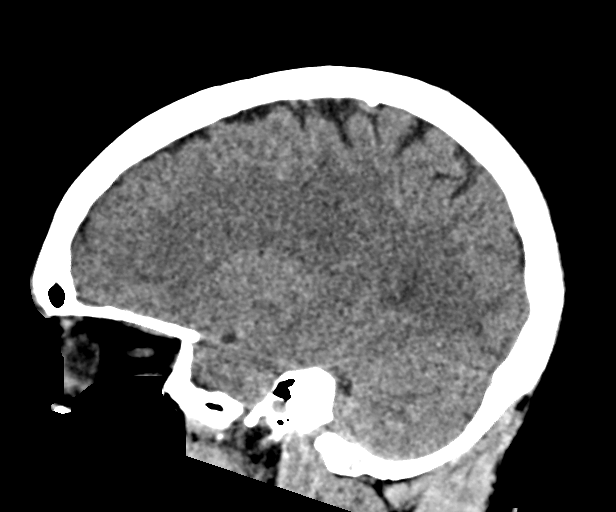

[16 of 47 positions shown; findings below may reference images not displayed]

FINDINGS: Brain: There is no evidence of acute infarct, intracranial
hemorrhage, mass, midline shift, or extra-axial fluid collection.
The ventricles and sulci are normal. Prominent dural/falcine
calcification is noted at the vertex.

Vascular: No hyperdense vessel.

Skull: No fracture. Nonspecific 6 mm lucency in the left parietal
bone.

Sinuses/Orbits: Visualized paranasal sinuses and mastoid air cells
are clear. Orbits are unremarkable.

Other: None.
IMPRESSION: No evidence of acute intracranial abnormality.

## 2021-04-01 ENCOUNTER — Encounter (HOSPITAL_BASED_OUTPATIENT_CLINIC_OR_DEPARTMENT_OTHER): Payer: Self-pay

## 2021-04-01 ENCOUNTER — Emergency Department (HOSPITAL_BASED_OUTPATIENT_CLINIC_OR_DEPARTMENT_OTHER)
Admission: EM | Admit: 2021-04-01 | Discharge: 2021-04-01 | Disposition: A | Discharge status: Home / Self Care | Payer: 59 | Attending: Emergency Medicine | Admitting: Emergency Medicine

## 2021-04-01 ENCOUNTER — Other Ambulatory Visit: Payer: Self-pay

## 2021-04-01 DIAGNOSIS — U071 COVID-19: Secondary | ICD-10-CM | POA: Diagnosis not present

## 2021-04-01 DIAGNOSIS — F17210 Nicotine dependence, cigarettes, uncomplicated: Secondary | ICD-10-CM | POA: Diagnosis not present

## 2021-04-01 DIAGNOSIS — M545 Low back pain, unspecified: Secondary | ICD-10-CM | POA: Diagnosis not present

## 2021-04-01 DIAGNOSIS — R519 Headache, unspecified: Secondary | ICD-10-CM | POA: Diagnosis present

## 2021-04-01 DIAGNOSIS — Z28310 Unvaccinated for covid-19: Secondary | ICD-10-CM | POA: Insufficient documentation

## 2021-04-01 LAB — RESP PANEL BY RT-PCR (FLU A&B, COVID) ARPGX2
Influenza A by PCR: NEGATIVE
Influenza B by PCR: NEGATIVE
SARS Coronavirus 2 by RT PCR: POSITIVE — AB

## 2021-04-01 LAB — URINALYSIS, MICROSCOPIC (REFLEX)

## 2021-04-01 LAB — URINALYSIS, ROUTINE W REFLEX MICROSCOPIC
Bilirubin Urine: NEGATIVE
Glucose, UA: NEGATIVE mg/dL
Ketones, ur: NEGATIVE mg/dL
Leukocytes,Ua: NEGATIVE
Nitrite: NEGATIVE
Protein, ur: NEGATIVE mg/dL
Specific Gravity, Urine: 1.02 (ref 1.005–1.030)
pH: 7.5 (ref 5.0–8.0)

## 2021-04-01 LAB — PREGNANCY, URINE: Preg Test, Ur: NEGATIVE

## 2021-04-01 MED ORDER — DIPHENHYDRAMINE HCL 50 MG/ML IJ SOLN
12.5000 mg | Freq: Once | INTRAMUSCULAR | Status: AC
  Administered 2021-04-01: 12.5 mg via INTRAVENOUS
  Filled 2021-04-01: qty 1

## 2021-04-01 MED ORDER — ONDANSETRON 4 MG PO TBDP: 4.0000 mg | ORAL_TABLET | Freq: Three times a day (TID) | ORAL | 0 refills | Status: DC | PRN

## 2021-04-01 MED ORDER — PROCHLORPERAZINE EDISYLATE 10 MG/2ML IJ SOLN
10.0000 mg | Freq: Once | INTRAMUSCULAR | Status: AC
  Administered 2021-04-01: 10 mg via INTRAVENOUS
  Filled 2021-04-01: qty 2

## 2021-04-01 MED ORDER — BENZONATATE 100 MG PO CAPS: 100.0000 mg | ORAL_CAPSULE | Freq: Three times a day (TID) | ORAL | 0 refills | Status: DC

## 2021-04-01 MED ORDER — ACETAMINOPHEN ER 650 MG PO TBCR: 650.0000 mg | EXTENDED_RELEASE_TABLET | Freq: Three times a day (TID) | ORAL | 0 refills | Status: DC | PRN

## 2021-04-01 MED ORDER — KETOROLAC TROMETHAMINE 15 MG/ML IJ SOLN
15.0000 mg | Freq: Once | INTRAMUSCULAR | Status: AC
  Administered 2021-04-01: 15 mg via INTRAVENOUS
  Filled 2021-04-01: qty 1

## 2021-04-01 NOTE — ED Provider Notes (Signed)
[facility] EMERGENCY DEPARTMENT Provider Note   CSN: [id number] Arrival date & time: 04/01/21  1218     History Chief Complaint  Patient presents with  . Back Pain    Christine Barnes is a 35 y.o. female with a past medical history significant for anxiety, depression, hx kidney stone, and seizures who presents to the ED due to diffuse back pain, headache, chills, rhinorrhea, and cough x3 days.  Patient denies sick contacts or known COVID exposures.  She is currently unvaccinated for COVID-19.  Patient states her back pain is located in the mid to lower back regions.  Denies saddle paresthesias, bowel/bladder incontinence, lower extremity numbness/tingling, lower extremity weakness.  Denies history of IV drug use.  Admits to having a 101.51F temperature yesterday. Denies urinary and vaginal symptoms. Headache is located on the right side described as a throbbing sensation.  Denies associated visual changes, speech changes, unilateral weakness, photophobia, and phonophobia.  Patient has a history of migraines and notes migraine feels similar.  Denies worst headache of her life. No head injury. Denies chest pain, shortness of breath, abdominal pain, nausea, vomiting, diarrhea.  No treatment prior to arrival.  No aggravating or alleviating symptoms.  History obtained from patient and past medical records. No interpreter used during encounter.      Past Medical History:  Diagnosis Date  . Abnormal Pap smear   . Anxiety    hx of meds  . Depression    hx of meds./PTSD  . Genital warts   . Gonorrhea   . Kidney stone    during pregnancy  . Recurrent upper respiratory infection (URI)   . Seizures (HCC)    in high school, unknown cause (last age 54)  . Trichomonas   . Urinary tract infection     There are no problems to display for this patient.   Past Surgical History:  Procedure Laterality Date  . NO PAST SURGERIES    . TUBAL LIGATION       OB History    Gravida   3   Para  1   Term  1   Preterm  0   AB  0   Living  1     SAB  0   IAB  0   Ectopic  0   Multiple  0   Live Births  1           No family history on file.  Social History   Tobacco Use  . Smoking status: Current Every Day Smoker    Packs/day: 0.50  . Smokeless tobacco: Never Used  Vaping Use  . Vaping Use: Never used  Substance Use Topics  . Alcohol use: Not Currently  . Drug use: No    Home Medications Prior to Admission medications   Medication Sig Start Date End Date Taking? Authorizing Provider  promethazine (PHENERGAN) 25 MG tablet Take 1 tablet (25 mg total) by mouth every 6 (six) hours as needed for nausea or vomiting. 07/17/16   Arthor Captain, PA-C    Allergies    Ioxaglate, Ivp dye [iodinated diagnostic agents], and Metrizamide  Review of Systems   Review of Systems  Constitutional: Positive for chills and fever.  HENT: Positive for rhinorrhea.   Eyes: Negative for visual disturbance.  Respiratory: Positive for cough. Negative for shortness of breath.   Cardiovascular: Negative for chest pain.  Gastrointestinal: Negative for abdominal pain, diarrhea, nausea and vomiting.  Musculoskeletal: Positive for back pain.  Neurological:  Positive for headaches.  All other systems reviewed and are negative.   Physical Exam Updated Vital Signs BP (!) 130/96 (BP Location: Left Arm)   Pulse 92   Temp 98.5 F (36.9 C) (Oral)   Resp 18   Ht 5\' 8"  (1.727 m)   Wt 67.1 kg   LMP 03/15/2021   SpO2 100%   BMI 22.50 kg/m   Physical Exam Vitals and nursing note reviewed.  Constitutional:      General: She is not in acute distress.    Appearance: She is not ill-appearing.  HENT:     Head: Normocephalic.  Eyes:     Pupils: Pupils are equal, round, and reactive to light.  Neck:     Comments: No meningismus. Cardiovascular:     Rate and Rhythm: Normal rate and regular rhythm.     Pulses: Normal pulses.     Heart sounds: Normal heart sounds.  No murmur heard. No friction rub. No gallop.   Pulmonary:     Effort: Pulmonary effort is normal.     Breath sounds: Normal breath sounds.     Comments: Respirations equal and unlabored, patient able to speak in full sentences, lungs clear to auscultation bilaterally Abdominal:     General: Abdomen is flat. There is no distension.     Palpations: Abdomen is soft.     Tenderness: There is no abdominal tenderness. There is no guarding or rebound.  Musculoskeletal:        General: Normal range of motion.     Cervical back: Neck supple.     Comments: No thoracic or  Lumbar midline tenderness. Diffuse tenderness in thoracic and lumbar paraspinal regions.   Skin:    General: Skin is warm and dry.  Neurological:     General: No focal deficit present.     Mental Status: She is alert.     Comments: Speech is clear, able to follow commands CN III-XII intact Normal strength in upper and lower extremities bilaterally including dorsiflexion and plantar flexion, strong and equal grip strength Sensation grossly intact throughout Moves extremities without ataxia, coordination intact No pronator drift Ambulates without difficulty  Psychiatric:        Mood and Affect: Mood normal.        Behavior: Behavior normal.     ED Results / Procedures / Treatments   Labs (all labs ordered are listed, but only abnormal results are displayed) Labs Reviewed  RESP PANEL BY RT-PCR (FLU A&B, COVID) ARPGX2 - Abnormal; Notable for the following components:      Result Value   SARS Coronavirus 2 by RT PCR POSITIVE (*)    All other components within normal limits  URINALYSIS, ROUTINE W REFLEX MICROSCOPIC - Abnormal; Notable for the following components:   APPearance HAZY (*)    Hgb urine dipstick TRACE (*)    All other components within normal limits  URINALYSIS, MICROSCOPIC (REFLEX) - Abnormal; Notable for the following components:   Bacteria, UA MANY (*)    All other components within normal limits   PREGNANCY, URINE    EKG None  Radiology No results found.  Procedures Procedures   Medications Ordered in ED Medications  prochlorperazine (COMPAZINE) injection 10 mg (10 mg Intravenous Given 04/01/21 1343)  diphenhydrAMINE (BENADRYL) injection 12.5 mg (12.5 mg Intravenous Given 04/01/21 1341)  ketorolac (TORADOL) 15 MG/ML injection 15 mg (15 mg Intravenous Given 04/01/21 1342)    ED Course  I have reviewed the triage vital signs and the nursing  notes.  Pertinent labs & imaging results that were available during my care of the patient were reviewed by me and considered in my medical decision making (see chart for details).  Clinical Course as of 04/01/21 1411  Fri Apr 01, 2021  1309 Preg Test, Ur: NEGATIVE [CA]  1309 Hgb urine dipstick(!): TRACE [CA]  1309 Bacteria, UA(!): MANY [CA]  1309 Squamous Epithelial / LPF: 6-10 [CA]  1354 SARS Coronavirus 2 by RT PCR(!): POSITIVE [CA]    Clinical Course User Index [CA] Mannie Stabile, PA-C   MDM Rules/Calculators/A&P                         35 year old female presents to the ED due to myalgias, headache, cough, and rhinorrhea x3 days.  She is currently unvaccinated for COVID-19. No sick contacts or known COVID exposures. No chest pain or shortness of breath.  Upon arrival, stable vitals.  Patient in no acute distress and non-ill-appearing.  Physical exam reassuring.  Abdomen soft, nondistended, nontender.  No meningismus to suggest meningitis.  No thoracic or lumbar midline tenderness.  Diffuse thoracic and lumbar paraspinal tenderness.  Patient able to ambulate in the ED without difficulty.  Suspicion for central cord compression or cauda equina. Low suspicion for discitis or osteomyelitis. Suspect back pain related to myalgias. UA to rule out pyelonephritis. COVID/influenza test to rule out infection. Suspect symptoms related to viral etiology. Migraine cocktail given.  COVID positive. Suspect symptoms related to COVID infection.   UA significant for trace hematuria and many bacteria.  No signs of infection.  Suspect contamination given 6-10 squamous cells.  Low suspicion for pyelonephritis. Pregnancy test negative.  Patient admits to resolution in headache after migraine cocktail.  Patient discharged with symptomatic treatment.  Quarantine guidelines discussed with patient. Strict ED precautions discussed with patient. Patient states understanding and agrees to plan. Patient discharged home in no acute distress and stable vitals.  Christine Barnes was evaluated in Emergency Department on 04/01/2021 for the symptoms described in the history of present illness. She was evaluated in the context of the global COVID-19 pandemic, which necessitated consideration that the patient might be at risk for infection with the SARS-CoV-2 virus that causes COVID-19. Institutional protocols and algorithms that pertain to the evaluation of patients at risk for COVID-19 are in a state of rapid change based on information released by regulatory bodies including the CDC and federal and state organizations. These policies and algorithms were followed during the patient's care in the ED.  Final Clinical Impression(s) / ED Diagnoses Final diagnoses:  COVID-19 virus infection    Rx / DC Orders ED Discharge Orders    None       Jesusita Oka 04/01/21 1419    Linwood Dibbles, MD 04/02/21 [phone]

## 2021-04-01 NOTE — ED Triage Notes (Addendum)
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

## 2021-04-01 NOTE — Discharge Instructions (Addendum)
As discussed, your COVID test is positive.  I suspect your symptoms are related to COVID infection.  I am sending you home with symptomatic treatment.  Take as needed.  Continue to self quarantine for 7 days.  You must be fever free for 24 hours without medication to end your quarantine.  Please follow-up with PCP if symptoms not improved within the next week.  Return to the ER for any worsening symptoms.

## 2021-04-03 ENCOUNTER — Telehealth: Payer: Self-pay | Admitting: Unknown Physician Specialty

## 2021-04-03 NOTE — Telephone Encounter (Signed)
Called to discuss with patient about COVID-19 symptoms and the use of one of the available treatments for those with mild to moderate Covid symptoms and at a high risk of hospitalization.  Pt appears to qualify for outpatient treatment due to co-morbid conditions and/or a member of an at-risk group in accordance with the FDA Emergency Use Authorization.    Symptom onset: 4/20 Vaccinated: no Booster? no IQualifiers: ethnicity  Discussed oral option and mab.  Pt refusing both.    Christine Barnes

## 2022-11-14 ENCOUNTER — Emergency Department (HOSPITAL_BASED_OUTPATIENT_CLINIC_OR_DEPARTMENT_OTHER)
Admission: EM | Admit: 2022-11-14 | Discharge: 2022-11-14 | Disposition: A | Discharge status: Home / Self Care | Payer: Self-pay | Attending: Emergency Medicine | Admitting: Emergency Medicine

## 2022-11-14 ENCOUNTER — Other Ambulatory Visit: Payer: Self-pay

## 2022-11-14 ENCOUNTER — Encounter (HOSPITAL_BASED_OUTPATIENT_CLINIC_OR_DEPARTMENT_OTHER): Payer: Self-pay | Admitting: Emergency Medicine

## 2022-11-14 DIAGNOSIS — R519 Headache, unspecified: Secondary | ICD-10-CM | POA: Insufficient documentation

## 2022-11-14 DIAGNOSIS — R1110 Vomiting, unspecified: Secondary | ICD-10-CM | POA: Insufficient documentation

## 2022-11-14 DIAGNOSIS — R55 Syncope and collapse: Secondary | ICD-10-CM | POA: Insufficient documentation

## 2022-11-14 HISTORY — DX: Migraine, unspecified, not intractable, without status migrainosus: G43.909

## 2022-11-14 LAB — PREGNANCY, URINE: Preg Test, Ur: NEGATIVE

## 2022-11-14 LAB — CBC
HCT: 40 % (ref 36.0–46.0)
Hemoglobin: 13.1 g/dL (ref 12.0–15.0)
MCH: 27.6 pg (ref 26.0–34.0)
MCHC: 32.8 g/dL (ref 30.0–36.0)
MCV: 84.2 fL (ref 80.0–100.0)
Platelets: 123 10*3/uL — ABNORMAL LOW (ref 150–400)
RBC: 4.75 MIL/uL (ref 3.87–5.11)
RDW: 13.6 % (ref 11.5–15.5)
WBC: 5.7 10*3/uL (ref 4.0–10.5)
nRBC: 0 % (ref 0.0–0.2)

## 2022-11-14 LAB — BASIC METABOLIC PANEL
Anion gap: 6 (ref 5–15)
BUN: 7 mg/dL (ref 6–20)
CO2: 25 mmol/L (ref 22–32)
Calcium: 9.3 mg/dL (ref 8.9–10.3)
Chloride: 106 mmol/L (ref 98–111)
Creatinine, Ser: 0.61 mg/dL (ref 0.44–1.00)
GFR, Estimated: >60 mL/min (ref >60)
Glucose, Bld: 109 mg/dL — ABNORMAL HIGH (ref 70–99)
Potassium: 3.7 mmol/L (ref 3.5–5.1)
Sodium: 137 mmol/L (ref 135–145)

## 2022-11-14 MED ORDER — ONDANSETRON 4 MG PO TBDP
4.0000 mg | ORAL_TABLET | Freq: Once | ORAL | Status: AC
  Administered 2022-11-14: 4 mg via ORAL
  Filled 2022-11-14: qty 1

## 2022-11-14 MED ORDER — DIPHENHYDRAMINE HCL 50 MG/ML IJ SOLN
25.0000 mg | Freq: Once | INTRAMUSCULAR | Status: AC
  Administered 2022-11-14: 25 mg via INTRAVENOUS
  Filled 2022-11-14: qty 1

## 2022-11-14 MED ORDER — SODIUM CHLORIDE 0.9 % IV BOLUS
1000.0000 mL | Freq: Once | INTRAVENOUS | Status: AC
  Administered 2022-11-14: 1000 mL via INTRAVENOUS

## 2022-11-14 MED ORDER — METOCLOPRAMIDE HCL 5 MG/ML IJ SOLN
10.0000 mg | Freq: Once | INTRAMUSCULAR | Status: AC
  Administered 2022-11-14: 10 mg via INTRAVENOUS
  Filled 2022-11-14: qty 2

## 2022-11-14 NOTE — Discharge Instructions (Addendum)
Please read and follow all provided instructions.  Your diagnoses today include:  1. Acute nonintractable headache, unspecified headache type   2. Syncope, unspecified syncope type     Tests performed today include: Complete blood cell count: Normal Basic metabolic panel: Normal Urinalysis (urine test): No signs of infection Pregnancy test (urine or blood, in women only): Negative No signs of abnormal heart rhythm on EKG or other signs of heart irritation Vital signs. See below for your results today.   Medications:  In the Emergency Department you received: Reglan - antinausea/headache medication Benadryl - antihistamine to counteract potential side effects of reglan  Take any prescribed medications only as directed.  Additional information:  Follow any educational materials contained in this packet.  You are having a headache. No specific cause was found today for your headache. It may have been a migraine or other cause of headache. Stress, anxiety, fatigue, and depression are common triggers for headaches.   Your headache today does not appear to be life-threatening or require hospitalization, but often the exact cause of headaches is not determined in the emergency department. Therefore, follow-up with your doctor is very important to find out what may have caused your headache and whether or not you need any further diagnostic testing or treatment.   Sometimes headaches can appear benign (not harmful), but then more serious symptoms can develop which should prompt an immediate re-evaluation by your doctor or the emergency department.  BE VERY CAREFUL not to take multiple medicines containing Tylenol (also called acetaminophen). Doing so can lead to an overdose which can damage your liver and cause liver failure and possibly death.   Follow-up instructions: Please follow-up with your primary care provider in the next 3 days for further evaluation of your symptoms.   Return  instructions:  Please return to the Emergency Department if you experience worsening symptoms. Return if the medications do not resolve your headache, if it recurs, or if you have multiple episodes of vomiting or cannot keep down fluids. Return if you have a change from the usual headache. RETURN IMMEDIATELY IF you: Develop a sudden, severe headache Develop confusion or become poorly responsive or faint Develop a fever above 100.46F or problem breathing Have a change in speech, vision, swallowing, or understanding Develop new weakness, numbness, tingling, incoordination in your arms or legs Have a seizure Please return if you have any other emergent concerns.  Additional Information:  Your vital signs today were: BP 126/78   Pulse 70   Temp 98.9 F (37.2 C) (Oral)   Resp 17   Ht 5\' 8"  (1.727 m)   Wt 68 kg   LMP 10/17/2022 (Approximate)   SpO2 100%   BMI 22.81 kg/m  If your blood pressure (BP) was elevated above 135/85 this visit, please have this repeated by your doctor within one month. --------------

## 2022-11-14 NOTE — ED Triage Notes (Signed)
Pt complains of migraine x 3 days, nausea and vomiting x 3 days. Passed out today and fall onto floor. Denies any injury. Took tylenol earlier but vomited right after.

## 2022-11-14 NOTE — ED Notes (Signed)
Pt discharged to home. Discharge instructions have been discussed with patient and/or family members. Pt verbally acknowledges understanding d/c instructions, and endorses comprehension to checkout at registration before leaving.  °

## 2022-11-14 NOTE — ED Provider Notes (Signed)
[facility] EMERGENCY DEPARTMENT Provider Note   CSN: [id number] Arrival date & time: 11/14/22  1357     History  Chief Complaint  Patient presents with   Migraine   Fall    Christine Barnes is a 36 y.o. female.  Patient presents today for evaluation of headache and syncopal episode.  Patient has a history of migraine headaches.  She states that she gets severe migraines 2-3 times per year.  This morning she woke up with a headache that was slightly worse than usual.  This progressed.  It started as a frontal headache and then went to the bilateral head.  Patient developed light sensitivity and vomiting.  She tried taking Tylenol but vomited.  She also tried to drink some water at home and vomited.  No associated diarrhea, chest pain, shortness of breath.  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  Family came by to give her a birthday cake as today is her birthday and she was found down on the floor.  Patient is currently at her baseline.  Headache slightly improved during ED wait time.  Per triage report, patient reports headache for 3 days, however to me she states that headache started this morning and worsened today.      Home Medications Prior to Admission medications   Medication Sig Start Date End Date Taking? Authorizing Provider  acetaminophen (TYLENOL 8 HOUR) 650 MG CR tablet Take 1 tablet (650 mg total) by mouth every 8 (eight) hours as needed for pain. 04/01/21   Suzy Bouchard, PA-C  benzonatate (TESSALON) 100 MG capsule Take 1 capsule (100 mg total) by mouth every 8 (eight) hours. 04/01/21   Suzy Bouchard, PA-C  ondansetron (ZOFRAN ODT) 4 MG disintegrating tablet Take 1 tablet (4 mg total) by mouth every 8 (eight) hours as needed for nausea or vomiting. 04/01/21   Suzy Bouchard, PA-C  promethazine (PHENERGAN) 25 MG tablet Take 1 tablet (25 mg total) by mouth every 6 (six) hours as  needed for nausea or vomiting. 07/17/16   Margarita Mail, PA-C      Allergies    Ioxaglate, Ivp dye [iodinated contrast media], and Metrizamide    Review of Systems   Review of Systems  Physical Exam Updated Vital Signs BP 118/78   Pulse 84   Temp 98 F (36.7 C)   Resp 16   Ht 5\' 8"  (1.727 m)   Wt 68 kg   LMP 10/17/2022 (Approximate)   SpO2 100%   BMI 22.81 kg/m  Physical Exam Vitals and nursing note reviewed.  Constitutional:      Appearance: She is well-developed.  HENT:     Head: Normocephalic and atraumatic.     Right Ear: Tympanic membrane, ear canal and external ear normal.     Left Ear: Tympanic membrane, ear canal and external ear normal.     Nose: Nose normal.     Mouth/Throat:     Mouth: Mucous membranes are moist.     Pharynx: Uvula midline.  Eyes:     General: Lids are normal.     Extraocular Movements:     Right eye: No nystagmus.     Left eye: No nystagmus.     Conjunctiva/sclera: Conjunctivae normal.     Pupils: Pupils are equal, round, and reactive to light.  Cardiovascular:     Rate and Rhythm: Normal rate and regular rhythm.  Pulmonary:  Effort: Pulmonary effort is normal.     Breath sounds: Normal breath sounds.  Abdominal:     Palpations: Abdomen is soft.     Tenderness: There is no abdominal tenderness.  Musculoskeletal:     Cervical back: Normal range of motion and neck supple. No tenderness or bony tenderness.  Skin:    General: Skin is warm and dry.  Neurological:     Mental Status: She is alert and oriented to person, place, and time.     GCS: GCS eye subscore is 4. GCS verbal subscore is 5. GCS motor subscore is 6.     Cranial Nerves: No cranial nerve deficit.     Sensory: No sensory deficit.     Motor: No weakness.     Coordination: Coordination normal.     Gait: Gait normal.     Comments: Upper extremity myotomes tested bilaterally:  C5 Shoulder abduction 5/5 C6 Elbow flexion/wrist extension 5/5 C7 Elbow extension 5/5 C8  Finger flexion 5/5 T1 Finger abduction 5/5  Lower extremity myotomes tested bilaterally: L2 Hip flexion 5/5 L3 Knee extension 5/5 L4 Ankle dorsiflexion 5/5 S1 Ankle plantar flexion 5/5     ED Results / Procedures / Treatments   Labs (all labs ordered are listed, but only abnormal results are displayed) Labs Reviewed  CBC - Abnormal; Notable for the following components:      Result Value   Platelets 123 (*)    All other components within normal limits  BASIC METABOLIC PANEL - Abnormal; Notable for the following components:   Glucose, Bld 109 (*)    All other components within normal limits  PREGNANCY, URINE    EKG EKG Interpretation  Date/Time:  Tuesday November 14 2022 17:02:20 EST Ventricular Rate:  82 PR Interval:  178 QRS Duration: 86 QT Interval:  390 QTC Calculation: 456 R Axis:   63 Text Interpretation: Sinus rhythm ST elev, probable normal early repol pattern similar to May 2016 Confirmed by Pricilla Loveless [phone]) on 11/14/2022 5:08:06 PM  Radiology No results found.  Procedures Procedures    Medications Ordered in ED Medications  ondansetron (ZOFRAN-ODT) disintegrating tablet 4 mg (4 mg Oral Given 11/14/22 1422)  metoCLOPramide (REGLAN) injection 10 mg (10 mg Intravenous Given 11/14/22 1714)  diphenhydrAMINE (BENADRYL) injection 25 mg (25 mg Intravenous Given 11/14/22 1714)  sodium chloride 0.9 % bolus 1,000 mL ( Intravenous Stopped 11/14/22 1816)    ED Course/ Medical Decision Making/ A&P    Patient seen and examined. History obtained directly from patient. Work-up including labs, imaging, EKG ordered in triage, if performed, were reviewed.    Labs/EKG: Independently reviewed and interpreted.  This included: CBC unremarkable except for platelet count of 123; BMP unremarkable; negative pregnancy.  Added EKG due to syncopal spell.  Imaging: None ordered.  Considered CT head however patient with normal neuroexam and history of migraines.  Low concern for  intracranial bleeding at this time.  Will reevaluate after treatment.  Medications/Fluids: Ordered: IV Reglan, IV Benadryl, IV fluid bolus  Most recent vital signs reviewed and are as follows: BP 118/78   Pulse 84   Temp 98 F (36.7 C)   Resp 16   Ht 5\' 8"  (1.727 m)   Wt 68 kg   LMP 10/17/2022 (Approximate)   SpO2 100%   BMI 22.81 kg/m   Initial impression: Migraine headache, likely vasovagal syncope due to vomiting and headache pain.  Gradual onset of headache, low concern for subarachnoid bleeding.  6:26 PM RN called out  stating that patient requesting discharge.  Reassessment performed. Patient appears stable.  States that she is feeling better.  Labs personally reviewed and interpreted including: EKG, no sign of prolonged QT, Brugada syndrome, WPW, heart block, hypertrophy or other arrhythmia  Reviewed pertinent lab work and imaging with patient at bedside. Questions answered.   Most current vital signs reviewed and are as follows: BP 126/78   Pulse 70   Temp 98.9 F (37.2 C) (Oral)   Resp 17   Ht 5\' 8"  (1.727 m)   Wt 68 kg   LMP 10/17/2022 (Approximate)   SpO2 100%   BMI 22.81 kg/m   Plan: Discharge to home.   Prescriptions written for: None  Other home care instructions discussed: Rest, hydration  ED return instructions discussed: Patient counseled to return if they have weakness in their arms or legs, slurred speech, trouble walking or talking, confusion, trouble with their balance, or if they have any other concerns. Patient verbalizes understanding and agrees with plan.   Follow-up instructions discussed: Patient encouraged to follow-up with their PCP in 7 days with any recurrent or persistent symptoms.                           Medical Decision Making Amount and/or Complexity of Data Reviewed Labs: ordered.  Risk Prescription drug management.   In regards to the patient's headache, critical differentials were considered including subarachnoid  hemorrhage, intracerebral hemorrhage, epidural/subdural hematoma, pituitary apoplexy, vertebral/carotid artery dissection, giant cell arteritis, central venous thrombosis, reversible cerebral vasoconstriction, acute angle closure glaucoma, idiopathic intracranial hypertension, bacterial meningitis, viral encephalitis, carbon monoxide poisoning, posterior reversible encephalopathy syndrome, pre-eclampsia.   Reg flag symptoms related to these causes were considered including systemic symptoms (fever, weight loss), neurologic symptoms (confusion, mental status change, vision change, associated seizure), acute or sudden "thunderclap" onset, patient age 23 or older with new or progressive headache, patient of any age with first headache or change in headache pattern, pregnant or postpartum status, history of HIV or other immunocompromise, history of cancer, headache occurring with exertion, associated neck or shoulder pain, associated traumatic injury, concurrent use of anticoagulation, family history of spontaneous SAH, and concurrent drug use.    Other benign, more common causes of headache were considered including migraine, tension-type headache, cluster headache, referred pain from other cause such as sinus infection, dental pain, trigeminal neuralgia.   On exam, patient has a reassuring neuro exam including baseline mental status, no significant neck pain or meningeal signs, no signs of severe infection or fever.   Patient also with syncopal episode in the setting of vomiting, headache pain.  EKG reassuring.  Blood counts reassuring without signs of infection or anemia.  The patient's vital signs, pertinent lab work and imaging were reviewed and interpreted as discussed in the ED course. Hospitalization was considered for further testing, treatments, or serial exams/observation. However as patient is well-appearing, has a stable exam over the course of their evaluation, and reassuring studies today, I do  not feel that they warrant admission at this time. This plan was discussed with the patient who verbalizes agreement and comfort with this plan and seems reliable and able to return to the Emergency Department with worsening or changing symptoms.          Final Clinical Impression(s) / ED Diagnoses Final diagnoses:  Acute nonintractable headache, unspecified headache type  Syncope, unspecified syncope type    Rx / DC Orders ED Discharge Orders  None         Carlisle Cater, PA-C 11/14/22 Greer Ee    Sherwood Gambler, MD 11/18/22 [phone]

## 2023-04-10 ENCOUNTER — Emergency Department (HOSPITAL_BASED_OUTPATIENT_CLINIC_OR_DEPARTMENT_OTHER): Payer: No Typology Code available for payment source

## 2023-04-10 ENCOUNTER — Other Ambulatory Visit: Payer: Self-pay

## 2023-04-10 ENCOUNTER — Emergency Department (HOSPITAL_BASED_OUTPATIENT_CLINIC_OR_DEPARTMENT_OTHER)
Admission: EM | Admit: 2023-04-10 | Discharge: 2023-04-10 | Disposition: A | Discharge status: Home / Self Care | Payer: No Typology Code available for payment source | Attending: Emergency Medicine | Admitting: Emergency Medicine

## 2023-04-10 ENCOUNTER — Encounter (HOSPITAL_BASED_OUTPATIENT_CLINIC_OR_DEPARTMENT_OTHER): Payer: Self-pay | Admitting: Pediatrics

## 2023-04-10 DIAGNOSIS — R5383 Other fatigue: Secondary | ICD-10-CM | POA: Insufficient documentation

## 2023-04-10 DIAGNOSIS — T703XXA Caisson disease [decompression sickness], initial encounter: Secondary | ICD-10-CM | POA: Insufficient documentation

## 2023-04-10 DIAGNOSIS — N390 Urinary tract infection, site not specified: Secondary | ICD-10-CM

## 2023-04-10 DIAGNOSIS — R079 Chest pain, unspecified: Secondary | ICD-10-CM | POA: Diagnosis present

## 2023-04-10 LAB — URINALYSIS, ROUTINE W REFLEX MICROSCOPIC
Glucose, UA: NEGATIVE mg/dL
Ketones, ur: >=80 mg/dL — AB
Leukocytes,Ua: NEGATIVE
Nitrite: POSITIVE — AB
Protein, ur: 30 mg/dL — AB
Specific Gravity, Urine: 1.025 (ref 1.005–1.030)
pH: 6.5 (ref 5.0–8.0)

## 2023-04-10 LAB — T4, FREE: Free T4: 0.82 ng/dL (ref 0.61–1.12)

## 2023-04-10 LAB — URINALYSIS, MICROSCOPIC (REFLEX)

## 2023-04-10 LAB — BASIC METABOLIC PANEL
Anion gap: 8 (ref 5–15)
BUN: 9 mg/dL (ref 6–20)
CO2: 24 mmol/L (ref 22–32)
Calcium: 9 mg/dL (ref 8.9–10.3)
Chloride: 99 mmol/L (ref 98–111)
Creatinine, Ser: 0.82 mg/dL (ref 0.44–1.00)
GFR, Estimated: >60 mL/min (ref >60)
Glucose, Bld: 175 mg/dL — ABNORMAL HIGH (ref 70–99)
Potassium: 3.2 mmol/L — ABNORMAL LOW (ref 3.5–5.1)
Sodium: 131 mmol/L — ABNORMAL LOW (ref 135–145)

## 2023-04-10 LAB — TROPONIN I (HIGH SENSITIVITY)
Troponin I (High Sensitivity): 4 ng/L (ref <18)
Troponin I (High Sensitivity): 6 ng/L (ref <18)

## 2023-04-10 LAB — CBC
HCT: 40.9 % (ref 36.0–46.0)
Hemoglobin: 13.5 g/dL (ref 12.0–15.0)
MCH: 27.6 pg (ref 26.0–34.0)
MCHC: 33 g/dL (ref 30.0–36.0)
MCV: 83.6 fL (ref 80.0–100.0)
Platelets: 187 10*3/uL (ref 150–400)
RBC: 4.89 MIL/uL (ref 3.87–5.11)
RDW: 13.4 % (ref 11.5–15.5)
WBC: 7.3 10*3/uL (ref 4.0–10.5)
nRBC: 0 % (ref 0.0–0.2)

## 2023-04-10 LAB — PREGNANCY, URINE: Preg Test, Ur: NEGATIVE

## 2023-04-10 LAB — TSH: TSH: 0.535 u[IU]/mL (ref 0.350–4.500)

## 2023-04-10 LAB — D-DIMER, QUANTITATIVE: D-Dimer, Quant: 0.3 ug/mL-FEU (ref 0.00–0.50)

## 2023-04-10 MED ORDER — POTASSIUM CHLORIDE CRYS ER 20 MEQ PO TBCR
40.0000 meq | EXTENDED_RELEASE_TABLET | Freq: Once | ORAL | Status: AC
  Administered 2023-04-10: 40 meq via ORAL
  Filled 2023-04-10: qty 2

## 2023-04-10 MED ORDER — CEPHALEXIN 500 MG PO CAPS: 500.0000 mg | ORAL_CAPSULE | Freq: Three times a day (TID) | ORAL | 0 refills | Status: AC

## 2023-04-10 NOTE — ED Provider Notes (Signed)
[place] [facility] Provider Note   CSN: [id number] Arrival date & time: 04/10/23  1041     History {Add pertinent medical, surgical, social history, OB history to HPI:1} Chief Complaint  Patient presents with   Chest Pain    Christine Barnes is a 37 y.o. female.  HPI     Monday chest pressure, squeezing, fatigue, heaviness Went to bed , woke up and began having dyspnea Still feeling it throughout the day, stabbing pain to shoulder area Feels like she has headache, lightheaded. No syncope  Smoking, etoh occ, no recent travel or surgeries, does work from home not on new medication or blood thinners. No ocp. Mom has blood clot hx.  No abdominal pain, constipation, fever, chills. Not positional.  Past Medical History:  Diagnosis Date   Abnormal Pap smear    Anxiety    hx of meds   Depression    hx of meds./PTSD   Genital warts    Gonorrhea    Kidney stone    during pregnancy   Migraine    Recurrent upper respiratory infection (URI)    Seizures (HCC)    in high school, unknown cause (last age 5)   Trichomonas    Urinary tract infection     Home Medications Prior to Admission medications   Medication Sig Start Date End Date Taking? Authorizing Provider  acetaminophen (TYLENOL 8 HOUR) 650 MG CR tablet Take 1 tablet (650 mg total) by mouth every 8 (eight) hours as needed for pain. 04/01/21   Mannie Stabile, PA-C  benzonatate (TESSALON) 100 MG capsule Take 1 capsule (100 mg total) by mouth every 8 (eight) hours. 04/01/21   Mannie Stabile, PA-C  ondansetron (ZOFRAN ODT) 4 MG disintegrating tablet Take 1 tablet (4 mg total) by mouth every 8 (eight) hours as needed for nausea or vomiting. 04/01/21   Mannie Stabile, PA-C  promethazine (PHENERGAN) 25 MG tablet Take 1 tablet (25 mg total) by mouth every 6 (six) hours as needed for nausea or vomiting. 07/17/16   Arthor Captain, PA-C      Allergies    Ioxaglate, Ivp dye  [iodinated contrast media], and Metrizamide    Review of Systems   Review of Systems  Physical Exam Updated Vital Signs BP 131/89 (BP Location: Left Arm)   Pulse (!) 123   Temp 98.4 F (36.9 C) (Oral)   Resp (!) 22   Ht 5\' 8"  (1.727 m)   Wt 71.2 kg   LMP 03/28/2023   SpO2 100%   BMI 23.87 kg/m  Physical Exam  ED Results / Procedures / Treatments   Labs (all labs ordered are listed, but only abnormal results are displayed) Labs Reviewed  BASIC METABOLIC PANEL  CBC  PREGNANCY, URINE  TROPONIN I (HIGH SENSITIVITY)    EKG EKG Interpretation  Date/Time:  Tuesday April 10 2023 10:46:52 EDT Ventricular Rate:  121 PR Interval:  163 QRS Duration: 87 QT Interval:  312 QTC Calculation: 443 R Axis:   76 Text Interpretation: Sinus tachycardia LAE, consider biatrial enlargement Since prior ECG, rate has increased Confirmed by Alvira Monday ([id number]) on 04/10/2023 10:52:55 AM  Radiology No results found.  Procedures Procedures  {Document cardiac monitor, telemetry assessment procedure when appropriate:1}  Medications Ordered in ED Medications - No data to display  ED Course/ Medical Decision Making/ A&P   {   Click here for ABCD2, HEART and other calculatorsREFRESH Note before signing :1}  37yo female with no significant medical history presents with concern for shortness of breath and chest pain.   {Document critical care time when appropriate:1} {Document review of labs and clinical decision tools ie heart score, Chads2Vasc2 etc:1}  {Document your independent review of radiology images, and any outside records:1} {Document your discussion with family members, caretakers, and with consultants:1} {Document social determinants of health affecting pt's care:1} {Document your decision making why or why not admission, treatments were needed:1} Final Clinical Impression(s) / ED Diagnoses Final diagnoses:  None    Rx / DC Orders ED Discharge  Orders     None

## 2023-04-10 NOTE — ED Triage Notes (Signed)
C/O shortness of breath and chest tightness started yesterday while at rest

## 2023-04-11 LAB — URINE CULTURE

## 2023-07-13 ENCOUNTER — Emergency Department (HOSPITAL_BASED_OUTPATIENT_CLINIC_OR_DEPARTMENT_OTHER)
Admission: EM | Admit: 2023-07-13 | Discharge: 2023-07-14 | Disposition: A | Discharge status: Home / Self Care | Payer: No Typology Code available for payment source | Attending: Emergency Medicine | Admitting: Emergency Medicine

## 2023-07-13 ENCOUNTER — Other Ambulatory Visit: Payer: Self-pay

## 2023-07-13 ENCOUNTER — Encounter (HOSPITAL_BASED_OUTPATIENT_CLINIC_OR_DEPARTMENT_OTHER): Payer: Self-pay

## 2023-07-13 DIAGNOSIS — I10 Essential (primary) hypertension: Secondary | ICD-10-CM | POA: Insufficient documentation

## 2023-07-13 DIAGNOSIS — R519 Headache, unspecified: Secondary | ICD-10-CM | POA: Diagnosis present

## 2023-07-13 DIAGNOSIS — G4489 Other headache syndrome: Secondary | ICD-10-CM | POA: Insufficient documentation

## 2023-07-13 NOTE — ED Triage Notes (Signed)
Pt reports headache x1 week and now complains of pain "shooting down her veins in her neck". Pt states that Excedrin usually works for her but has not been giving relief. Pt recently diagnosed with HTN.

## 2023-07-14 MED ORDER — KETOROLAC TROMETHAMINE 60 MG/2ML IM SOLN
30.0000 mg | Freq: Once | INTRAMUSCULAR | Status: AC
  Administered 2023-07-14: 30 mg via INTRAMUSCULAR
  Filled 2023-07-14: qty 2

## 2023-07-14 MED ORDER — ACETAMINOPHEN 500 MG PO TABS
1000.0000 mg | ORAL_TABLET | Freq: Once | ORAL | Status: AC
  Administered 2023-07-14: 1000 mg via ORAL
  Filled 2023-07-14: qty 2

## 2023-07-14 NOTE — ED Provider Notes (Signed)
[place] [facility] Provider Note   CSN: [id number] Arrival date & time: 07/13/23  2349     History  Chief Complaint - headache   Christine Barnes is a 37 y.o. female.  The history is provided by the patient.  Patient with history of hypertension presents with headache.  Patient reports she gets these headaches about once a month.  This episode started around 1 week ago.  Its mostly on the left side.  She is now having some pain go from her head into her neck, but this is not worsening.  No fevers or vomiting No new visual changes.  No new weakness. Patient reports recently being diagnosed with hypertension was started on propranolol & norvasc.  She has been taking his medications every day.  She has PCP follow-up next week.  Denies known history of CVA Tonight her aunt told her that her veins were "sticking out" of her neck and she should go to the ER    Home Medications Prior to Admission medications   Not on File      Allergies    Ioxaglate, Ivp dye [iodinated contrast media], and Metrizamide    Review of Systems   Review of Systems  Constitutional:  Negative for fever.  Eyes:  Negative for visual disturbance.  Gastrointestinal:  Negative for vomiting.  Neurological:  Positive for headaches. Negative for weakness.    Physical Exam Updated Vital Signs BP (!) 159/102 (BP Location: Left Arm)   Pulse 76   Temp 98.1 F (36.7 C) (Oral)   Resp 16   Ht 1.727 m (5\' 8" )   Wt 65.8 kg   SpO2 100%   BMI 22.05 kg/m  Physical Exam CONSTITUTIONAL: Well developed/well nourished, smiling and in no distress HEAD: Normocephalic/atraumatic EYES: EOMI/PERRL, no nystagmus, no ptosis, normal fundoscopic exam (no papilledema)  ENMT: Mucous membranes moist, no thrill no pulsating mass to either side of her neck Mildly distended external jugular veins are noted, no overlying erythema or obvious tenderness NECK: supple no meningeal signs, no  bruits SPINE/BACK:entire spine nontender CV: S1/S2 noted, no murmurs/rubs/gallops noted LUNGS: Lungs are clear to auscultation bilaterally, no apparent distress NEURO:Awake/alert, face symmetric, no arm or leg drift is noted Equal 5/5 strength with shoulder abduction, elbow flex/extension, wrist flex/extension in upper extremities and equal hand grips bilaterally Equal 5/5 strength with hip flexion,knee flex/extension, foot dorsi/plantar flexion Cranial nerves 3/4/5/6/06/18/09/11/12 tested and intact Gait normal without ataxia No past pointing Sensation to light touch intact in all extremities EXTREMITIES: pulses normal, full ROM SKIN: warm, color normal PSYCH: no abnormalities of mood noted, alert and oriented to situation  ED Results / Procedures / Treatments   Labs (all labs ordered are listed, but only abnormal results are displayed) Labs Reviewed - No data to display  EKG None  Radiology No results found.  Procedures Procedures    Medications Ordered in ED Medications  ketorolac (TORADOL) injection 30 mg (30 mg Intramuscular Given 07/14/23 0050)  acetaminophen (TYLENOL) tablet 1,000 mg (1,000 mg Oral Given 07/14/23 0050)    ED Course/ Medical Decision Making/ A&P Clinical Course as of 07/14/23 0056  Sat Jul 14, 2023  0054 Patient reports long history of headaches, this episode started about a week ago.  Tonight her aunt told her that her veins were popping out and she needs to be seen in the ER She is overall in no distress, smiling and her exam is appropriate She declines IV medicines.  Will treat  with Toradol and Tylenol and discharged home.  She has PCP follow-up, she may need further medicine adjustment for her blood pressure. I have low suspicion for acute neurologic emergency at this time [DW]    Clinical Course User Index [DW] Zadie Rhine, MD                                 Medical Decision Making Risk OTC drugs. Prescription drug management.   This  patient presents to the ED for concern of headache, this involves an extensive number of treatment options, and is a complaint that carries with it a high risk of complications and morbidity.  The differential diagnosis includes but is not limited to subarachnoid hemorrhage, intracranial hemorrhage, meningitis, encephalitis, CVST, temporal arteritis, idiopathic intracranial hypertension, migraine    Comorbidities that complicate the patient evaluation: Patient's presentation is complicated by their history of hypertension  Additional history obtained: Records reviewed Care Everywhere/External Records   Medicines ordered and prescription drug management: I ordered medication including toradol  for headache   Test Considered: I considered neuroimaging, but since patient has long history of headaches similar to this, will defer at this time  Complexity of problems addressed: Patient's presentation is most consistent with  acute presentation with potential threat to life or bodily function  Disposition: After consideration of the diagnostic results and the patient's response to treatment,  I feel that the patent would benefit from discharge   .           Final Clinical Impression(s) / ED Diagnoses Final diagnoses:  Other headache syndrome  Primary hypertension    Rx / DC Orders ED Discharge Orders     None         Zadie Rhine, MD 07/14/23 [phone]

## 2023-07-14 NOTE — Discharge Instructions (Addendum)

## 2023-08-01 ENCOUNTER — Other Ambulatory Visit: Payer: Self-pay

## 2023-08-01 ENCOUNTER — Encounter (HOSPITAL_BASED_OUTPATIENT_CLINIC_OR_DEPARTMENT_OTHER): Payer: Self-pay

## 2023-08-01 ENCOUNTER — Emergency Department (HOSPITAL_BASED_OUTPATIENT_CLINIC_OR_DEPARTMENT_OTHER)
Admission: EM | Admit: 2023-08-01 | Discharge: 2023-08-02 | Disposition: A | Discharge status: Home / Self Care | Payer: No Typology Code available for payment source | Attending: Emergency Medicine | Admitting: Emergency Medicine

## 2023-08-01 DIAGNOSIS — G43809 Other migraine, not intractable, without status migrainosus: Secondary | ICD-10-CM | POA: Diagnosis not present

## 2023-08-01 DIAGNOSIS — U071 COVID-19: Secondary | ICD-10-CM | POA: Insufficient documentation

## 2023-08-01 DIAGNOSIS — R519 Headache, unspecified: Secondary | ICD-10-CM | POA: Diagnosis present

## 2023-08-01 LAB — BASIC METABOLIC PANEL
Anion gap: 10 (ref 5–15)
BUN: 11 mg/dL (ref 6–20)
CO2: 25 mmol/L (ref 22–32)
Calcium: 9.7 mg/dL (ref 8.9–10.3)
Chloride: 102 mmol/L (ref 98–111)
Creatinine, Ser: 0.77 mg/dL (ref 0.44–1.00)
GFR, Estimated: >60 mL/min (ref >60)
Glucose, Bld: 97 mg/dL (ref 70–99)
Potassium: 3.5 mmol/L (ref 3.5–5.1)
Sodium: 137 mmol/L (ref 135–145)

## 2023-08-01 LAB — CBC WITH DIFFERENTIAL/PLATELET
Abs Immature Granulocytes: 0.01 10*3/uL (ref 0.00–0.07)
Basophils Absolute: 0 10*3/uL (ref 0.0–0.1)
Basophils Relative: 1 %
Eosinophils Absolute: 0.1 10*3/uL (ref 0.0–0.5)
Eosinophils Relative: 1 %
HCT: 38.3 % (ref 36.0–46.0)
Hemoglobin: 12.4 g/dL (ref 12.0–15.0)
Immature Granulocytes: 0 %
Lymphocytes Relative: 22 %
Lymphs Abs: 1.2 10*3/uL (ref 0.7–4.0)
MCH: 26.8 pg (ref 26.0–34.0)
MCHC: 32.4 g/dL (ref 30.0–36.0)
MCV: 82.7 fL (ref 80.0–100.0)
Monocytes Absolute: 1.1 10*3/uL — ABNORMAL HIGH (ref 0.1–1.0)
Monocytes Relative: 20 %
Neutro Abs: 3 10*3/uL (ref 1.7–7.7)
Neutrophils Relative %: 56 %
Platelets: 141 10*3/uL — ABNORMAL LOW (ref 150–400)
RBC: 4.63 MIL/uL (ref 3.87–5.11)
RDW: 14.6 % (ref 11.5–15.5)
WBC: 5.4 10*3/uL (ref 4.0–10.5)
nRBC: 0 % (ref 0.0–0.2)

## 2023-08-01 LAB — TROPONIN I (HIGH SENSITIVITY): Troponin I (High Sensitivity): <2 ng/L (ref <18)

## 2023-08-01 MED ORDER — KETOROLAC TROMETHAMINE 30 MG/ML IJ SOLN
15.0000 mg | Freq: Once | INTRAMUSCULAR | Status: AC
  Administered 2023-08-02: 15 mg via INTRAVENOUS
  Filled 2023-08-01: qty 1

## 2023-08-01 MED ORDER — PROCHLORPERAZINE EDISYLATE 10 MG/2ML IJ SOLN
10.0000 mg | Freq: Once | INTRAMUSCULAR | Status: AC
  Administered 2023-08-02: 10 mg via INTRAVENOUS
  Filled 2023-08-01: qty 2

## 2023-08-01 MED ORDER — SODIUM CHLORIDE 0.9 % IV BOLUS
1000.0000 mL | Freq: Once | INTRAVENOUS | Status: AC
  Administered 2023-08-02: 1000 mL via INTRAVENOUS

## 2023-08-01 NOTE — ED Provider Notes (Signed)
[place] [facility]  Provider Note  CSN: [id number] Arrival date & time: 08/01/23 2046  History Chief Complaint  Patient presents with   Migraine    Christine Barnes is a 37 y.o. female with history of migraines reports onset of typical headache earlier today and then some midsternal chest discomfort around noon. She tried to take some Excedrin this afternoon but had some vomiting afterwards. No reported fever but has felt hot and cold. She was possibly exposed to Covid a few days ago. No cough.    Home Medications Prior to Admission medications   Not on File     Allergies    Ioxaglate, Ivp dye [iodinated contrast media], and Metrizamide   Review of Systems   Review of Systems Please see HPI for pertinent positives and negatives  Physical Exam BP 127/81   Pulse 81   Temp 98.6 F (37 C) (Temporal)   Resp 18   Ht 5\' 8"  (1.727 m)   Wt 65 kg   LMP 07/10/2023   SpO2 100%   BMI 21.79 kg/m   Physical Exam Vitals and nursing note reviewed.  Constitutional:      Appearance: Normal appearance.  HENT:     Head: Normocephalic and atraumatic.     Nose: Nose normal.     Mouth/Throat:     Mouth: Mucous membranes are moist.  Eyes:     Extraocular Movements: Extraocular movements intact.     Conjunctiva/sclera: Conjunctivae normal.  Cardiovascular:     Rate and Rhythm: Normal rate.  Pulmonary:     Effort: Pulmonary effort is normal.     Breath sounds: Normal breath sounds.  Abdominal:     General: Abdomen is flat.     Palpations: Abdomen is soft.     Tenderness: There is no abdominal tenderness.  Musculoskeletal:        General: No swelling. Normal range of motion.     Cervical back: Neck supple.  Skin:    General: Skin is warm and dry.  Neurological:     General: No focal deficit present.     Mental Status: She is alert and oriented to person, place, and time.     Cranial Nerves: No cranial nerve deficit.     Sensory:  No sensory deficit.     Motor: No weakness.     Gait: Gait normal.  Psychiatric:        Mood and Affect: Mood normal.     ED Results / Procedures / Treatments   EKG EKG Interpretation Date/Time:  Wednesday August 01 2023 21:08:32 EDT Ventricular Rate:  68 PR Interval:  182 QRS Duration:  92 QT Interval:  377 QTC Calculation: 401 R Axis:   76  Text Interpretation: Sinus rhythm Normal ECG Since last tracing Rate slower Confirmed by Susy Frizzle [id number]) on 08/01/2023 11:43:03 PM  Procedures Procedures  Medications Ordered in the ED Medications  ketorolac (TORADOL) 30 MG/ML injection 15 mg (has no administration in time range)  prochlorperazine (COMPAZINE) injection 10 mg (has no administration in time range)  sodium chloride 0.9 % bolus 1,000 mL (has no administration in time range)    Initial Impression and Plan  Patient here with migraine and/or viral symptoms. Her chest pain was atypical and since resolved. She has a reassuring exam and vitals. Labs done in triage show normal CBC, BMP and Trop. EKG is normal. Will give a headache cocktail, IVF and check Covid/Flu/RSV swab   ED  Course       MDM Rules/Calculators/A&P Medical Decision Making Amount and/or Complexity of Data Reviewed Labs: ordered.  Risk Prescription drug management.     Final Clinical Impression(s) / ED Diagnoses Final diagnoses:  None    Rx / DC Orders ED Discharge Orders     None

## 2023-08-01 NOTE — ED Triage Notes (Signed)
Pt has hx of migraines and states she developed CP today as well.   +N/V May have been exposed to Covid

## 2023-08-02 LAB — RESP PANEL BY RT-PCR (RSV, FLU A&B, COVID)  RVPGX2
Influenza A by PCR: NEGATIVE
Influenza B by PCR: NEGATIVE
Resp Syncytial Virus by PCR: NEGATIVE
SARS Coronavirus 2 by RT PCR: POSITIVE — AB

## 2024-07-22 ENCOUNTER — Encounter (HOSPITAL_BASED_OUTPATIENT_CLINIC_OR_DEPARTMENT_OTHER): Payer: Self-pay | Admitting: Urology

## 2024-07-22 ENCOUNTER — Other Ambulatory Visit: Payer: Self-pay

## 2024-07-22 ENCOUNTER — Emergency Department (HOSPITAL_BASED_OUTPATIENT_CLINIC_OR_DEPARTMENT_OTHER)
Admission: EM | Admit: 2024-07-22 | Discharge: 2024-07-22 | Disposition: A | Discharge status: Home / Self Care | Payer: Self-pay | Attending: Emergency Medicine | Admitting: Emergency Medicine

## 2024-07-22 ENCOUNTER — Emergency Department (HOSPITAL_BASED_OUTPATIENT_CLINIC_OR_DEPARTMENT_OTHER): Payer: Self-pay

## 2024-07-22 DIAGNOSIS — R0789 Other chest pain: Secondary | ICD-10-CM

## 2024-07-22 DIAGNOSIS — M436 Torticollis: Secondary | ICD-10-CM

## 2024-07-22 DIAGNOSIS — R519 Headache, unspecified: Secondary | ICD-10-CM

## 2024-07-22 LAB — CBC WITH DIFFERENTIAL/PLATELET
Abs Immature Granulocytes: 0.01 K/uL (ref 0.00–0.07)
Basophils Absolute: 0.1 K/uL (ref 0.0–0.1)
Basophils Relative: 1 %
Eosinophils Absolute: 0.1 K/uL (ref 0.0–0.5)
Eosinophils Relative: 1 %
HCT: 37.6 % (ref 36.0–46.0)
Hemoglobin: 12.1 g/dL (ref 12.0–15.0)
Immature Granulocytes: 0 %
Lymphocytes Relative: 23 %
Lymphs Abs: 1.4 K/uL (ref 0.7–4.0)
MCH: 26.8 pg (ref 26.0–34.0)
MCHC: 32.2 g/dL (ref 30.0–36.0)
MCV: 83.2 fL (ref 80.0–100.0)
Monocytes Absolute: 0.6 K/uL (ref 0.1–1.0)
Monocytes Relative: 11 %
Neutro Abs: 3.9 K/uL (ref 1.7–7.7)
Neutrophils Relative %: 64 %
Platelets: 169 K/uL (ref 150–400)
RBC: 4.52 MIL/uL (ref 3.87–5.11)
RDW: 15.5 % (ref 11.5–15.5)
WBC: 6 K/uL (ref 4.0–10.5)
nRBC: 0 % (ref 0.0–0.2)

## 2024-07-22 LAB — TROPONIN T, HIGH SENSITIVITY: Troponin T High Sensitivity: <15 ng/L (ref 0–19)

## 2024-07-22 LAB — BASIC METABOLIC PANEL WITH GFR
Anion gap: 8 (ref 5–15)
BUN: 6 mg/dL (ref 6–20)
CO2: 27 mmol/L (ref 22–32)
Calcium: 9.4 mg/dL (ref 8.9–10.3)
Chloride: 102 mmol/L (ref 98–111)
Creatinine, Ser: 0.7 mg/dL (ref 0.44–1.00)
GFR, Estimated: >60 mL/min (ref >60)
Glucose, Bld: 91 mg/dL (ref 70–99)
Potassium: 4.3 mmol/L (ref 3.5–5.1)
Sodium: 138 mmol/L (ref 135–145)

## 2024-07-22 MED ORDER — DIAZEPAM 5 MG/ML IJ SOLN
5.0000 mg | Freq: Once | INTRAMUSCULAR | Status: AC
  Administered 2024-07-22 (×2): 5 mg via INTRAVENOUS
  Filled 2024-07-22: qty 2

## 2024-07-22 MED ORDER — DEXAMETHASONE 4 MG PO TABS
8.0000 mg | ORAL_TABLET | Freq: Once | ORAL | Status: AC
  Administered 2024-07-22 (×2): 8 mg via ORAL
  Filled 2024-07-22: qty 2

## 2024-07-22 MED ORDER — SODIUM CHLORIDE 0.9 % IV BOLUS
1000.0000 mL | Freq: Once | INTRAVENOUS | Status: AC
  Administered 2024-07-22 (×2): 1000 mL via INTRAVENOUS

## 2024-07-22 MED ORDER — MAGNESIUM SULFATE 2 GM/50ML IV SOLN
2.0000 g | Freq: Once | INTRAVENOUS | Status: AC
  Administered 2024-07-22 (×2): 2 g via INTRAVENOUS
  Filled 2024-07-22: qty 50

## 2024-07-22 MED ORDER — METOCLOPRAMIDE HCL 5 MG PO TABS: 5.0000 mg | ORAL_TABLET | Freq: Four times a day (QID) | ORAL | 0 refills | Status: DC | PRN

## 2024-07-22 MED ORDER — METOCLOPRAMIDE HCL 5 MG PO TABS: 5.0000 mg | ORAL_TABLET | Freq: Four times a day (QID) | ORAL | 0 refills | Status: AC | PRN

## 2024-07-22 MED ORDER — TIZANIDINE HCL 4 MG PO TABS: 4.0000 mg | ORAL_TABLET | Freq: Three times a day (TID) | ORAL | 0 refills | Status: AC | PRN

## 2024-07-22 MED ORDER — KETOROLAC TROMETHAMINE 15 MG/ML IJ SOLN
15.0000 mg | Freq: Once | INTRAMUSCULAR | Status: AC
  Administered 2024-07-22 (×2): 15 mg via INTRAVENOUS
  Filled 2024-07-22: qty 1

## 2024-07-22 MED ORDER — METOCLOPRAMIDE HCL 5 MG/ML IJ SOLN
10.0000 mg | Freq: Once | INTRAMUSCULAR | Status: AC
  Administered 2024-07-22 (×2): 10 mg via INTRAVENOUS
  Filled 2024-07-22: qty 2

## 2024-07-22 NOTE — ED Notes (Signed)
 Pt back from CT/XR tolerated well. Place back on monitor and IVF. Awaiting results.

## 2024-07-22 NOTE — ED Triage Notes (Signed)
 Pt states has bad headache and states neck is stiff that started this am  Limited ROM to neck  Denies fever    H/o migraines

## 2024-07-22 NOTE — Discharge Instructions (Addendum)
 It was a pleasure caring for you today in the emergency department.  Your symptoms improved today after receiving medication for pain and muscle spasm. You also received some fluids. Your CT scan did not show any abnormalities in your head or neck. Your EKG and bloodwork also did not show any significant abnormalities.  It is most likely that your symptoms are due to a muscle spasm in your neck. You can take tizanidine  as needed which acts as a muscle relaxer. You were prescribed metoclopramide  which can help with your headaches, this is the pill version of the medicine you were given in the ED. You can also take your excedrin if that seems to work better for you.  Be sure to get plenty of rest the next couple days and drink plenty of clear fluids.  Please return to the emergency department for any worsening or worrisome symptoms.

## 2024-07-22 NOTE — ED Notes (Signed)
 Patient transported to X-ray

## 2024-07-22 NOTE — ED Provider Notes (Signed)
 [facility] Provider Note   CSN: [id number] Arrival date & time: 07/22/24  1521     Patient presents with: Torticollis and Headache   Christine Barnes is a 38 y.o. female.   39 year old female with history of seizures, migraines presenting due to neck stiffness and headache States she woke up this morning around 730 with a headache and also noticed significant neck stiffness.  Was unable to move her neck very well without pain Denies hx injury or neck massages/chiropractor  Does have chronic migraines but this does not feel like her typical migraine Also describes central nonradiating chest pressure since about 11 AM today.  Denies history of blood clots or cardiac disease Was feeling little bit nauseous earlier today but was able to keep down food and drink Denies abdominal pain, fevers, shortness of breath. Denies taking any meds, has not tried anything for pain. S/p tubal ligation    Headache Associated symptoms: neck pain and neck stiffness   Associated symptoms: no abdominal pain, no cough, no dizziness, no eye pain, no fever, no hearing loss, no nausea, no numbness, no photophobia, no vomiting and no weakness        Prior to Admission medications   Medication Sig Start Date End Date Taking? Authorizing Provider  tiZANidine  (ZANAFLEX ) 4 MG tablet Take 1 tablet (4 mg total) by mouth every 8 (eight) hours as needed for muscle spasms. 07/22/24  Yes Romelle Booty, MD  metoCLOPramide  (REGLAN ) 5 MG tablet Take 1 tablet (5 mg total) by mouth every 6 (six) hours as needed (headache). 07/22/24   Elnor Jayson LABOR, DO    Allergies: Ioxaglate, Ivp dye [iodinated contrast media], and Metrizamide    Review of Systems  Constitutional:  Negative for chills and fever.  HENT:  Negative for hearing loss.   Eyes:  Negative for photophobia, pain and visual disturbance.  Respiratory:  Positive for chest tightness. Negative for cough and shortness  of breath.   Cardiovascular:  Positive for chest pain. Negative for palpitations and leg swelling.  Gastrointestinal:  Negative for abdominal pain, nausea and vomiting.  Genitourinary:  Negative for dysuria.  Musculoskeletal:  Positive for neck pain and neck stiffness.  Neurological:  Positive for headaches. Negative for dizziness, facial asymmetry, weakness and numbness.    Updated Vital Signs BP 133/86 (BP Location: Left Arm)   Pulse 94   Temp 98.6 F (37 C) (Oral)   Resp 14   Ht 5' 8 (1.727 m)   Wt 65 kg   SpO2 100%   BMI 21.79 kg/m   Physical Exam Constitutional:      General: She is not in acute distress. HENT:     Head: Normocephalic and atraumatic.  Eyes:     Extraocular Movements: Extraocular movements intact.     Pupils: Pupils are equal, round, and reactive to light.  Neck:     Comments: Restricted ROM in all directions due to pain/stiffness. Moderately tender throughout neck musculature, worse on Left Cardiovascular:     Rate and Rhythm: Normal rate and regular rhythm.     Heart sounds: Normal heart sounds. No murmur heard. Pulmonary:     Effort: Pulmonary effort is normal. No respiratory distress.     Breath sounds: Normal breath sounds.  Abdominal:     General: There is no distension.     Palpations: Abdomen is soft.     Tenderness: There is no abdominal tenderness.  Musculoskeletal:  General: No swelling.  Neurological:     Mental Status: She is alert.     Cranial Nerves: No cranial nerve deficit, dysarthria or facial asymmetry.     Sensory: No sensory deficit.     Motor: No weakness.     (all labs ordered are listed, but only abnormal results are displayed) Labs Reviewed  CBC WITH DIFFERENTIAL/PLATELET  BASIC METABOLIC PANEL WITH GFR  TROPONIN T, HIGH SENSITIVITY    EKG: EKG Interpretation Date/Time:  Tuesday July 22 2024 16:22:16 EDT Ventricular Rate:  74 PR Interval:  186 QRS Duration:  87 QT Interval:  387 QTC  Calculation: 430 R Axis:   68  Text Interpretation: Sinus rhythm Early repolarization similar prior 12/23 Confirmed by Elnor Savant ([id number]) on 07/22/2024 4:49:01 PM  Radiology: DG Chest 1 View Result Date: 07/22/2024 CLINICAL DATA:  abnormal CXR, repeat, chest pain EXAM: CHEST  1 VIEW COMPARISON:  Chest radiograph from earlier the same day. FINDINGS: Redemonstration of 4-5 mm opacity overlying the right upper lung zone, which may represent an EN face vessel versus calcified granuloma. Similar structure was seen on the prior exam from 2014 and favored benign in etiology. No further follow-up is recommended. Bilateral lung fields are otherwise clear. No acute consolidation or lung collapse. Bilateral costophrenic angles are clear. Normal cardio-mediastinal silhouette. No acute osseous abnormalities. The soft tissues are within normal limits. IMPRESSION: No active disease. Electronically Signed   By: Ree Molt M.D.   On: 07/22/2024 18:02   DG Chest 2 View Result Date: 07/22/2024 CLINICAL DATA:  chest pain EXAM: CHEST - 2 VIEW COMPARISON:  Chest x-ray 04/10/2023, chest x-ray 03/07/2019 FINDINGS: Patient is slightly rotated. The heart and mediastinal contours are within normal limits. 4 mm nodule like opacity along the right upper lobe may represent an en face pulmonary artery versus granuloma. No focal consolidation. No pulmonary edema. No pleural effusion. No pneumothorax. No acute osseous abnormality. IMPRESSION: 1. A 4 mm nodule like opacity along the right upper lobe may represent an en face pulmonary artery versus granuloma. Consider repeat chest x-ray frontal view. 2.  No acute cardiopulmonary abnormality. Electronically Signed   By: Morgane  Naveau M.D.   On: 07/22/2024 17:31   CT Cervical Spine Wo Contrast Result Date: 07/22/2024 CLINICAL DATA:  headache and neck pain; Neck pain, acute, no red flags EXAM: CT HEAD WITHOUT CONTRAST CT CERVICAL SPINE WITHOUT CONTRAST TECHNIQUE: Multidetector CT  imaging of the head and cervical spine was performed following the standard protocol without intravenous contrast. Multiplanar CT image reconstructions of the cervical spine were also generated. RADIATION DOSE REDUCTION: This exam was performed according to the departmental dose-optimization program which includes automated exposure control, adjustment of the mA and/or kV according to patient size and/or use of iterative reconstruction technique. COMPARISON:  None Available. FINDINGS: CT HEAD FINDINGS Brain: No evidence of large-territorial acute infarction. No parenchymal hemorrhage. No mass lesion. No extra-axial collection. No mass effect or midline shift. No hydrocephalus. Basilar cisterns are patent. Vascular: No hyperdense vessel. Skull: No acute fracture or focal lesion. Sinuses/Orbits: Paranasal sinuses and mastoid air cells are clear. The orbits are unremarkable. Other: None. CT CERVICAL SPINE FINDINGS Alignment: Normal. Skull base and vertebrae: No acute fracture. No aggressive appearing focal osseous lesion or focal pathologic process. Soft tissues and spinal canal: No prevertebral fluid or swelling. No visible canal hematoma. Upper chest: Unremarkable. Other: None. IMPRESSION: 1. No acute intracranial abnormality. 2. No acute displaced fracture or traumatic listhesis of the cervical spine. Electronically Signed  By: Morgane  Naveau M.D.   On: 07/22/2024 17:28   CT Head Wo Contrast Result Date: 07/22/2024 CLINICAL DATA:  headache and neck pain; Neck pain, acute, no red flags EXAM: CT HEAD WITHOUT CONTRAST CT CERVICAL SPINE WITHOUT CONTRAST TECHNIQUE: Multidetector CT imaging of the head and cervical spine was performed following the standard protocol without intravenous contrast. Multiplanar CT image reconstructions of the cervical spine were also generated. RADIATION DOSE REDUCTION: This exam was performed according to the departmental dose-optimization program which includes automated exposure  control, adjustment of the mA and/or kV according to patient size and/or use of iterative reconstruction technique. COMPARISON:  None Available. FINDINGS: CT HEAD FINDINGS Brain: No evidence of large-territorial acute infarction. No parenchymal hemorrhage. No mass lesion. No extra-axial collection. No mass effect or midline shift. No hydrocephalus. Basilar cisterns are patent. Vascular: No hyperdense vessel. Skull: No acute fracture or focal lesion. Sinuses/Orbits: Paranasal sinuses and mastoid air cells are clear. The orbits are unremarkable. Other: None. CT CERVICAL SPINE FINDINGS Alignment: Normal. Skull base and vertebrae: No acute fracture. No aggressive appearing focal osseous lesion or focal pathologic process. Soft tissues and spinal canal: No prevertebral fluid or swelling. No visible canal hematoma. Upper chest: Unremarkable. Other: None. IMPRESSION: 1. No acute intracranial abnormality. 2. No acute displaced fracture or traumatic listhesis of the cervical spine. Electronically Signed   By: Morgane  Naveau M.D.   On: 07/22/2024 17:28     Procedures   Medications Ordered in the ED  ketorolac  (TORADOL ) 15 MG/ML injection 15 mg (15 mg Intravenous Given 07/22/24 1640)  metoCLOPramide  (REGLAN ) injection 10 mg (10 mg Intravenous Given 07/22/24 1640)  sodium chloride  0.9 % bolus 1,000 mL (1,000 mLs Intravenous New Bag/Given 07/22/24 1638)  magnesium  sulfate IVPB 2 g 50 mL (2 g Intravenous New Bag/Given 07/22/24 1645)  diazepam  (VALIUM ) injection 5 mg (5 mg Intravenous Given 07/22/24 1641)  dexamethasone  (DECADRON ) tablet 8 mg (8 mg Oral Given 07/22/24 1756)                                    Medical Decision Making 38 year old female with history of migraines, s/p tubal ligation presenting due to neck stiffness and headache with chest pressure since this morning Afebrile, hemodynamically stable She has restricted range of motion of her neck with tenderness on exam, started when she woke up from  sleep, seems most consistent with torticollis and associated headache.  Does not have significant neurologic deficits on exam However given headache and neck pain will obtain CT head/c-spine. Pt has documented anaphylaxis to contrast so will not obtain CTA Also check CBC, BMP to eval for anemia or electrolyte normalities  Additionally low concern for ACS but will obtain EKG, troponin, chest x-ray for further evaluation of her chest pressure  Treat her headache with Reglan , Toradol , magnesium , and IV fluids Valium  5 mg x 1 for her neck stiffness   5:32 PM CT head/neck unremarkable CBC, BMP, initial troponin unremarkable EKG without signs of acute ischemia or arrhythmia Pt reevaluated, feels her headache and CP has resolved and her neck pain has greatly improved, able to move her head in all directions. Feeling tired from valium  CXR does show ?granuloma vs en face pulmonary artery, repeat 1 view was recommended. Ordered this  Pt given PO decadron  x1 to prevent rebound headache  6:06 PM Repeat CXR shows no active disease, redemonstration of finding above but previously favored to be  benign so no additional follow up is recommended Sent Rx for PRN reglan  and tizanidine  for any symptom recurrence Discussed return precautions and supportive care, advised against driving if feeling sedated at all with meds Pt stable for discharge   Amount and/or Complexity of Data Reviewed Labs: ordered. Radiology: ordered.  Risk Prescription drug management.        Final diagnoses:  Torticollis  Nonintractable headache, unspecified chronicity pattern, unspecified headache type  Atypical chest pain    ED Discharge Orders          Ordered    metoCLOPramide  (REGLAN ) 5 MG tablet  Every 6 hours PRN,   Status:  Discontinued        07/22/24 1747    tiZANidine  (ZANAFLEX ) 4 MG tablet  Every 8 hours PRN        07/22/24 1747    metoCLOPramide  (REGLAN ) 5 MG tablet  Every 6 hours PRN        07/22/24  1800               Romelle Booty, MD 07/22/24 1808    Elnor Jayson LABOR, DO 07/27/24 [phone]

## 2024-07-22 NOTE — ED Notes (Signed)
 Dc instructions given, pt verbalized understanding. Out of ED with steady gait. Not in visible distress with all belongings.
